# Patient Record
Sex: Male | Born: 1975 | Race: White | Hispanic: No | Marital: Married | Smoking: Current every day smoker
Health system: Southern US, Community
[De-identification: ages and names within clinical notes are randomized; demographics above are authoritative.]

## PROBLEM LIST (undated history)

## (undated) DIAGNOSIS — M1990 Unspecified osteoarthritis, unspecified site: Secondary | ICD-10-CM

## (undated) DIAGNOSIS — J449 Chronic obstructive pulmonary disease, unspecified: Secondary | ICD-10-CM

## (undated) DIAGNOSIS — M9110 Juvenile osteochondrosis of head of femur [Legg-Calve-Perthes], unspecified leg: Secondary | ICD-10-CM

## (undated) DIAGNOSIS — R42 Dizziness and giddiness: Secondary | ICD-10-CM

## (undated) DIAGNOSIS — K2970 Gastritis, unspecified, without bleeding: Secondary | ICD-10-CM

## (undated) DIAGNOSIS — Z972 Presence of dental prosthetic device (complete) (partial): Secondary | ICD-10-CM

## (undated) DIAGNOSIS — K219 Gastro-esophageal reflux disease without esophagitis: Secondary | ICD-10-CM

## (undated) DIAGNOSIS — I10 Essential (primary) hypertension: Secondary | ICD-10-CM

## (undated) DIAGNOSIS — K449 Diaphragmatic hernia without obstruction or gangrene: Secondary | ICD-10-CM

## (undated) DIAGNOSIS — R490 Dysphonia: Secondary | ICD-10-CM

## (undated) DIAGNOSIS — C4490 Unspecified malignant neoplasm of skin, unspecified: Secondary | ICD-10-CM

## (undated) DIAGNOSIS — J040 Acute laryngitis: Secondary | ICD-10-CM

## (undated) HISTORY — PX: SKIN CANCER EXCISION: SHX779

## (undated) HISTORY — PX: HIP SURGERY: SHX245

---

## 2005-09-18 ENCOUNTER — Ambulatory Visit (HOSPITAL_COMMUNITY): Admission: RE | Admit: 2005-09-18 | Discharge: 2005-09-18 | Payer: Self-pay | Admitting: Orthopedic Surgery

## 2007-10-04 ENCOUNTER — Ambulatory Visit (HOSPITAL_COMMUNITY): Admission: RE | Admit: 2007-10-04 | Discharge: 2007-10-04 | Payer: Self-pay | Admitting: Orthopedic Surgery

## 2008-06-04 ENCOUNTER — Emergency Department: Payer: Self-pay | Admitting: Emergency Medicine

## 2009-01-12 ENCOUNTER — Ambulatory Visit: Payer: Self-pay | Admitting: Unknown Physician Specialty

## 2010-03-15 ENCOUNTER — Ambulatory Visit: Payer: Self-pay | Admitting: Family Medicine

## 2010-03-20 ENCOUNTER — Ambulatory Visit: Payer: Self-pay | Admitting: Family Medicine

## 2010-04-12 ENCOUNTER — Ambulatory Visit: Payer: Self-pay | Admitting: Family Medicine

## 2011-02-16 ENCOUNTER — Emergency Department: Payer: Self-pay | Admitting: Emergency Medicine

## 2011-02-26 ENCOUNTER — Ambulatory Visit: Payer: Self-pay | Admitting: Family Medicine

## 2011-04-07 ENCOUNTER — Emergency Department: Payer: Self-pay | Admitting: Unknown Physician Specialty

## 2011-04-22 NOTE — Op Note (Signed)
NAME:  KELTIN, BAIRD NO.:  [id number]   MEDICAL RECORD NO.:  [id number]          PATIENT TYPE:  AMB   LOCATION:  DAY                          FACILITY:  [facility]   PHYSICIAN:  Ollen Gross, M.D.    DATE OF BIRTH:  1976/08/29   DATE OF PROCEDURE:  10/04/2007  DATE OF DISCHARGE:                               OPERATIVE REPORT   PREOPERATIVE DIAGNOSIS:  Right hip labral tear.   POSTOPERATIVE DIAGNOSES:  1. Right hip labral tear.  2. Chondral defect.  3. Hypertrophic ligamentum teres.   PROCEDURE:  Right hip arthroscopy with labral debridement and  chondroplasty.   SURGEON:  Aluisio.   ASSISTANT:  Avel Peace, P.A.-C.   ANESTHESIA:  General.   ESTIMATED BLOOD LOSS:  Minimal.   DRAINS:  None.   COMPLICATIONS:  None.   CONDITION:  Stable to recovery.   HISTORY:  Shigeru is a 35 year old male, history of Perthes disease as a  teenager.  He has gone on to develop significant right hip pain and  mechanical symptoms.  The popping is causing the most pain for him.  He  has had intra-articular injection on two occasions which has provided  excellent pain relief.  The mechanical symptoms are getting worse.  He  is not at a stage where he needs a total hip, and he presents now for  arthroscopy and debridement.   PROCEDURE IN DETAIL:  After successful administration of general  anesthetic, the patient was placed in the left lateral decubitus  position, and a perineal well-padded post is placed with the right leg  draped over the post, and the left leg is well padded.  The right foot  is placed in a traction boot which was well padded and is well secured.  Under fluoroscopic guidance, we provided traction to the hip to  adequately distract it.  The thigh is then prepped and draped in the  usual sterile fashion.  Spinal needles were then passed through the skin  into the joint at the anterior posterior peritrochanteric portals.  A  small incision was made  posteriorly, dilator placed, and the inflow  cannula passed into the joint.  Arthroscopic visualization proceeds.  The posterior aspect of the acetabulum and posterior labrum were normal.  Central acetabular and labrum were normal.  Anteriorly there is no  chondral defect, and the anterior labrum looks normal except that  anterior inferior there is a tear which appears to be protruding into  the joint just adjacent to an area of the femoral head where there is a  chondral defect.  The ligamentum teres is significantly hypertrophied,  and just adjacent to that, there is an area of chondral delamination on  the femoral head.  We created the anterior portal over the spinal needle  and with a combination of shaver and the ArthroCare, debrided the  ligament of teres back to a normal size.  The labral tear was debrided  back to a stable base with the shaver, and the ArthroCare sealed it off.  The chondral defects were treated with a shaver.  There was  about a 2 x  2-mm tethered piece of bone which was tethered to the cartilage, and I  removed that with the pituitary grabber.  We debrided the defect back to  a stable cartilaginous base, and it was not fully delaminating off the  bone.  It was probed and found to be stable and no longer mobile.  I  again inspected the joint.  There were no further tears or any loose  bodies present.  The arthroscopic equipment was then removed from the  anterior portal and 30 mL of 0.25% Marcaine with epi injected through  the inflow cannula and then that is removed.  Traction is removed off  the joint, and the portal sites were closed with interrupted 4-0 nylon.  A bulky, sterile dressing was applied.  Fluoroscopy confirms concentric  reduction.  The patient was then placed supine on the operating table,  awakened, and transferred to recovery in stable condition.      Ollen Gross, M.D.  Electronically Signed     FA/MEDQ  D:  10/04/2007  T:  10/05/2007   Job:  [id number]

## 2011-09-17 LAB — COMPREHENSIVE METABOLIC PANEL
ALT: 26
AST: 22
Albumin: 3.8
Alkaline Phosphatase: 80
BUN: 9
CO2: 28
Calcium: 9
Chloride: 99
Creatinine, Ser: 1.05
GFR calc Af Amer: >60
GFR calc non Af Amer: >60
Glucose, Bld: 100 — ABNORMAL HIGH
Potassium: 4
Sodium: 134 — ABNORMAL LOW
Total Bilirubin: 0.8
Total Protein: 6.8

## 2011-09-17 LAB — CBC
HCT: 44.1
Hemoglobin: 15
MCHC: 33.9
MCV: 89.2
Platelets: 271
RBC: 4.95
RDW: 13.6
WBC: 10.2

## 2011-09-17 LAB — PROTIME-INR
INR: 1.1
Prothrombin Time: 14.2

## 2011-09-17 LAB — APTT: aPTT: 28

## 2011-09-17 LAB — URINALYSIS, ROUTINE W REFLEX MICROSCOPIC
Bilirubin Urine: NEGATIVE
Glucose, UA: NEGATIVE
Hgb urine dipstick: NEGATIVE
Ketones, ur: NEGATIVE
Nitrite: NEGATIVE
Protein, ur: NEGATIVE
Specific Gravity, Urine: 1.02
Urobilinogen, UA: 0.2
pH: 6.5

## 2011-09-17 LAB — TYPE AND SCREEN
ABO/RH(D): A POS
Antibody Screen: NEGATIVE

## 2011-09-17 LAB — ABO/RH: ABO/RH(D): A POS

## 2012-06-04 ENCOUNTER — Ambulatory Visit: Payer: Self-pay | Admitting: Emergency Medicine

## 2012-06-04 LAB — URINALYSIS, COMPLETE
Bacteria: NEGATIVE
Bilirubin,UR: NEGATIVE
Blood: NEGATIVE
Glucose,UR: NEGATIVE mg/dL (ref 0–75)
Ketone: NEGATIVE
Leukocyte Esterase: NEGATIVE
Nitrite: NEGATIVE
Ph: 7 (ref 4.5–8.0)
Protein: NEGATIVE
Specific Gravity: 1.015 (ref 1.003–1.030)
Squamous Epithelial: NONE SEEN

## 2012-09-18 ENCOUNTER — Ambulatory Visit: Payer: Self-pay | Admitting: Internal Medicine

## 2013-03-31 ENCOUNTER — Ambulatory Visit: Payer: Self-pay

## 2013-10-10 ENCOUNTER — Ambulatory Visit: Payer: Self-pay

## 2014-05-22 ENCOUNTER — Emergency Department: Payer: Self-pay | Admitting: Emergency Medicine

## 2014-05-22 LAB — CBC
HCT: 49.3 % (ref 40.0–52.0)
HGB: 16.1 g/dL (ref 13.0–18.0)
MCH: 30.4 pg (ref 26.0–34.0)
MCHC: 32.7 g/dL (ref 32.0–36.0)
MCV: 93 fL (ref 80–100)
Platelet: 279 10*3/uL (ref 150–440)
RBC: 5.3 10*6/uL (ref 4.40–5.90)
RDW: 13.4 % (ref 11.5–14.5)
WBC: 15 10*3/uL — ABNORMAL HIGH (ref 3.8–10.6)

## 2014-05-22 LAB — URINALYSIS, COMPLETE
Bacteria: NONE SEEN
Bilirubin,UR: NEGATIVE
Glucose,UR: NEGATIVE mg/dL (ref 0–75)
Ketone: NEGATIVE
Leukocyte Esterase: NEGATIVE
Nitrite: NEGATIVE
Ph: 6 (ref 4.5–8.0)
Protein: NEGATIVE
RBC,UR: 125 /HPF (ref 0–5)
Specific Gravity: 1.016 (ref 1.003–1.030)
Squamous Epithelial: NONE SEEN
WBC UR: NONE SEEN /HPF (ref 0–5)

## 2014-05-22 LAB — COMPREHENSIVE METABOLIC PANEL
Albumin: 3.7 g/dL (ref 3.4–5.0)
Alkaline Phosphatase: 72 U/L
Anion Gap: 7 (ref 7–16)
BUN: 18 mg/dL (ref 7–18)
Bilirubin,Total: 0.3 mg/dL (ref 0.2–1.0)
Calcium, Total: 9.1 mg/dL (ref 8.5–10.1)
Chloride: 106 mmol/L (ref 98–107)
Co2: 27 mmol/L (ref 21–32)
Creatinine: 1.33 mg/dL — ABNORMAL HIGH (ref 0.60–1.30)
EGFR (African American): >60
EGFR (Non-African Amer.): >60
Glucose: 112 mg/dL — ABNORMAL HIGH (ref 65–99)
Osmolality: 282 (ref 275–301)
Potassium: 4.1 mmol/L (ref 3.5–5.1)
SGOT(AST): 27 U/L (ref 15–37)
SGPT (ALT): 28 U/L (ref 12–78)
Sodium: 140 mmol/L (ref 136–145)
Total Protein: 7.1 g/dL (ref 6.4–8.2)

## 2014-09-15 ENCOUNTER — Ambulatory Visit: Payer: Self-pay | Admitting: Physician Assistant

## 2014-12-14 ENCOUNTER — Ambulatory Visit: Payer: Self-pay | Admitting: Otolaryngology

## 2014-12-14 HISTORY — PX: THROAT SURGERY: SHX803

## 2015-02-01 ENCOUNTER — Encounter: Payer: Self-pay | Admitting: Otolaryngology

## 2015-02-06 ENCOUNTER — Encounter: Payer: Self-pay | Admitting: Otolaryngology

## 2015-04-02 LAB — SURGICAL PATHOLOGY

## 2015-04-08 NOTE — Op Note (Signed)
PATIENT NAME:  Kurt Patel, Kurt Patel MR#:  [id number] DATE OF BIRTH:  1976-03-04  DATE OF PROCEDURE:  12/14/2014  PREOPERATIVE DIAGNOSES: Leukoplakia of the vocal cords bilateral, dysphonia, tobacco abuse.   POSTOPERATIVE DIAGNOSIS: Leukoplakia of the vocal cords bilateral, dysphonia, tobacco abuse.   PROCEDURE PERFORMED: Suspension microlaryngoscopy with microflap excision.   SURGEON: Jerene Bears, M.D.   ANESTHESIA: General endotracheal anesthesia.   ESTIMATED BLOOD LOSS: Less than 1 mL   IV FLUIDS: Please see anesthesia record.   COMPLICATIONS: None.   DRAINS AND STENT PLACEMENT: None.   SPECIMENS: Left true vocal fold nodule biopsy and left mid vocal fold microflap excision.   INDICATIONS FOR PROCEDURE: The patient is a 39 year old male with history of chronic dysphonia, tobacco abuse, vocal abuse, found to have significant leukoplakia on previous exam in the office that had waxed and waned bilaterally. In the office, it was seen to be worse on his right true vocal fold on last laryngoscopy.   OPERATIVE FINDINGS: Severe sulcus and pseudosulcus, and bilateral vocal process ulcerations. Leukoplakia had actually improved on the right true vocal fold but had an area of thickened leukoplakia on the dorsal aspect of his left mid true vocal fold just adjacent to an ulceration. Microflap excision of this thickening leukoplakia was made. A small nodule posteriorly was also biopsied.   DESCRIPTION OF PROCEDURE: As the patient was identified in holding, benefits and risks of the procedure were discussed and consent was reviewed. The patient was taken to the Operating Room and placed in the supine position. General endotracheal anesthesia was induced with an MLT endotracheal tube. The patient was rotated 90 degrees, a shoulder roll was placed, a wettened Ray-Tec was placed to protect the patient's upper gums as the patient was edentulous on his maxilla. A Dedo laryngoscope was inserted in the  patient's oral cavity and advanced for visualization of the base of tongue, which revealed no abnormality, epiglottis no abnormality, the hypopharynx and piriform sinuses no abnormality. Then, the larynx was visualized and true vocal folds demonstrated multiple sulci bilaterally, a large ulceration of the vocal process bilaterally, left greater than right and heaping of tissue just inferior and superior to the ulceration on the left side. Palpation revealed some thickening of the leukoplakia on the dorsal aspect of his left true vocal fold. Anterior commissure revealed some edema anteriorly and pseudosulcus but no other abnormal mass or lesions.   At this time, epinephrine was placed in the patient's left true vocal fold. The decision was made to biopsy the worst looking area of leukoplakia in the most thickened area of leukoplakia. A sickle knife was used to make a vertical incision and then a microelevator was used to dissect the area of the lamina propria. Then, horizontal incisions with a sickle knife were made. This microflap was grasped with a right-to-left biting microclamp, and then using a sickle knife,  a small window of tissue was removed. Hemostasis was achieved using topical epinephrine. At this time, palpation of the heaped up  edges of the ulceration inferiorly, a decision was made to trim these edges and microclamp was used to grasp this small nodule and this was cut with a straight edge to the vocal cord with a 45 degree up-biting microscissors. This was also repeated anteriorly in front of the ulceration.   At this time, epinephrine was used for hemostasis and 4% lidocaine was spritzed on the patient's larynx and then the Dedo laryngoscope was removed from the patient's oral cavity without injury to  teeth, lips or gums. The wettened Ray-Tec was removed as well. The patient tolerated the procedure well.    ____________________________ Jerene Bears, MD ccv:kl D: 12/14/2014 08:10:27  ET T: 12/14/2014 12:44:39 ET JOB#: [id number]  cc: Jerene Bears, MD, <Dictator> Jerene Bears MD ELECTRONICALLY SIGNED 12/20/2014 13:24

## 2015-05-15 ENCOUNTER — Encounter: Payer: Self-pay | Admitting: *Deleted

## 2015-05-15 ENCOUNTER — Emergency Department
Admission: EM | Admit: 2015-05-15 | Discharge: 2015-05-15 | Disposition: A | Discharge status: Home / Self Care | Payer: Worker's Compensation | Attending: Emergency Medicine | Admitting: Emergency Medicine

## 2015-05-15 ENCOUNTER — Emergency Department: Payer: Worker's Compensation

## 2015-05-15 ENCOUNTER — Encounter: Payer: Self-pay | Admitting: Emergency Medicine

## 2015-05-15 DIAGNOSIS — R21 Rash and other nonspecific skin eruption: Secondary | ICD-10-CM | POA: Insufficient documentation

## 2015-05-15 DIAGNOSIS — Z720 Tobacco use: Secondary | ICD-10-CM | POA: Insufficient documentation

## 2015-05-15 DIAGNOSIS — T65891D Toxic effect of other specified substances, accidental (unintentional), subsequent encounter: Secondary | ICD-10-CM | POA: Diagnosis not present

## 2015-05-15 DIAGNOSIS — S0501XA Injury of conjunctiva and corneal abrasion without foreign body, right eye, initial encounter: Secondary | ICD-10-CM | POA: Diagnosis not present

## 2015-05-15 DIAGNOSIS — X58XXXD Exposure to other specified factors, subsequent encounter: Secondary | ICD-10-CM | POA: Insufficient documentation

## 2015-05-15 DIAGNOSIS — Y9389 Activity, other specified: Secondary | ICD-10-CM | POA: Insufficient documentation

## 2015-05-15 DIAGNOSIS — Y9289 Other specified places as the place of occurrence of the external cause: Secondary | ICD-10-CM | POA: Insufficient documentation

## 2015-05-15 DIAGNOSIS — H109 Unspecified conjunctivitis: Secondary | ICD-10-CM | POA: Insufficient documentation

## 2015-05-15 DIAGNOSIS — T2691XD Corrosion of right eye and adnexa, part unspecified, subsequent encounter: Secondary | ICD-10-CM

## 2015-05-15 DIAGNOSIS — L550 Sunburn of first degree: Secondary | ICD-10-CM | POA: Diagnosis not present

## 2015-05-15 DIAGNOSIS — T2050XA Corrosion of first degree of head, face, and neck, unspecified site, initial encounter: Secondary | ICD-10-CM | POA: Diagnosis not present

## 2015-05-15 DIAGNOSIS — Z79899 Other long term (current) drug therapy: Secondary | ICD-10-CM | POA: Insufficient documentation

## 2015-05-15 DIAGNOSIS — S0591XD Unspecified injury of right eye and orbit, subsequent encounter: Secondary | ICD-10-CM | POA: Diagnosis present

## 2015-05-15 DIAGNOSIS — Z77098 Contact with and (suspected) exposure to other hazardous, chiefly nonmedicinal, chemicals: Secondary | ICD-10-CM | POA: Insufficient documentation

## 2015-05-15 DIAGNOSIS — Y990 Civilian activity done for income or pay: Secondary | ICD-10-CM | POA: Insufficient documentation

## 2015-05-15 DIAGNOSIS — X58XXXA Exposure to other specified factors, initial encounter: Secondary | ICD-10-CM | POA: Insufficient documentation

## 2015-05-15 DIAGNOSIS — S0501XD Injury of conjunctiva and corneal abrasion without foreign body, right eye, subsequent encounter: Secondary | ICD-10-CM | POA: Diagnosis not present

## 2015-05-15 DIAGNOSIS — T543X1A Toxic effect of corrosive alkalis and alkali-like substances, accidental (unintentional), initial encounter: Secondary | ICD-10-CM | POA: Diagnosis not present

## 2015-05-15 DIAGNOSIS — T6591XA Toxic effect of unspecified substance, accidental (unintentional), initial encounter: Secondary | ICD-10-CM

## 2015-05-15 HISTORY — DX: Unspecified malignant neoplasm of skin, unspecified: C44.90

## 2015-05-15 MED ORDER — GENTAMICIN SULFATE 0.3 % OP SOLN: 1.0000 [drp] | Freq: Three times a day (TID) | OPHTHALMIC | Status: DC

## 2015-05-15 MED ORDER — TETRACAINE HCL 0.5 % OP SOLN
OPHTHALMIC | Status: AC
  Administered 2015-05-15: 14:00:00
  Filled 2015-05-15: qty 2

## 2015-05-15 MED ORDER — KETOROLAC TROMETHAMINE 10 MG PO TABS: 10.0000 mg | ORAL_TABLET | Freq: Four times a day (QID) | ORAL | Status: DC | PRN

## 2015-05-15 MED ORDER — KETOROLAC TROMETHAMINE 60 MG/2ML IM SOLN
60.0000 mg | Freq: Once | INTRAMUSCULAR | Status: AC
  Administered 2015-05-15: 60 mg via INTRAMUSCULAR

## 2015-05-15 MED ORDER — KETOROLAC TROMETHAMINE 60 MG/2ML IM SOLN
INTRAMUSCULAR | Status: AC
  Administered 2015-05-15: 60 mg via INTRAMUSCULAR
  Filled 2015-05-15: qty 2

## 2015-05-15 MED ORDER — ALOE VESTA 2-N-1 PROTECTIVE EX OINT: TOPICAL_OINTMENT | Freq: Two times a day (BID) | CUTANEOUS | Status: DC | PRN

## 2015-05-15 MED ORDER — DEXAMETHASONE SODIUM PHOSPHATE 10 MG/ML IJ SOLN
10.0000 mg | Freq: Once | INTRAMUSCULAR | Status: AC
  Administered 2015-05-15: 10 mg via INTRAMUSCULAR

## 2015-05-15 MED ORDER — NEOMYCIN-POLYMYXIN-DEXAMETH 3.5-10000-0.1 OP SUSP
2.0000 [drp] | Freq: Once | OPHTHALMIC | Status: AC
  Administered 2015-05-15: 2 [drp] via OPHTHALMIC

## 2015-05-15 MED ORDER — EYE WASH OPHTH SOLN
OPHTHALMIC | Status: AC
  Administered 2015-05-15: 14:00:00
  Filled 2015-05-15: qty 118

## 2015-05-15 MED ORDER — HYDROCODONE-ACETAMINOPHEN 5-325 MG PO TABS
ORAL_TABLET | ORAL | Status: AC
  Administered 2015-05-15: 1 via ORAL
  Filled 2015-05-15: qty 1

## 2015-05-15 MED ORDER — HYDROCODONE-ACETAMINOPHEN 5-325 MG PO TABS: 1.0000 | ORAL_TABLET | Freq: Four times a day (QID) | ORAL | Status: DC | PRN

## 2015-05-15 MED ORDER — FLUORESCEIN SODIUM 1 MG OP STRP
ORAL_STRIP | OPHTHALMIC | Status: AC
  Administered 2015-05-15: 1
  Filled 2015-05-15: qty 1

## 2015-05-15 MED ORDER — DEXAMETHASONE SODIUM PHOSPHATE 10 MG/ML IJ SOLN
INTRAMUSCULAR | Status: AC
  Administered 2015-05-15: 10 mg via INTRAMUSCULAR
  Filled 2015-05-15: qty 1

## 2015-05-15 MED ORDER — HYDROCODONE-ACETAMINOPHEN 5-325 MG PO TABS
1.0000 | ORAL_TABLET | Freq: Once | ORAL | Status: AC
  Administered 2015-05-15: 1 via ORAL

## 2015-05-15 MED ORDER — HYDROXYZINE HCL 50 MG PO TABS: 50.0000 mg | ORAL_TABLET | Freq: Three times a day (TID) | ORAL | Status: DC | PRN

## 2015-05-15 MED ORDER — FLUORESCEIN SODIUM 1 MG OP STRP
ORAL_STRIP | OPHTHALMIC | Status: AC
  Administered 2015-05-15: 14:00:00
  Filled 2015-05-15: qty 1

## 2015-05-15 MED ORDER — TETRACAINE HCL 0.5 % OP SOLN
OPHTHALMIC | Status: AC
  Administered 2015-05-15: 22:00:00
  Filled 2015-05-15: qty 2

## 2015-05-15 MED ORDER — NEOMYCIN-POLYMYXIN-DEXAMETH 3.5-10000-0.1 OP SUSP
OPHTHALMIC | Status: AC
  Administered 2015-05-15: 2 [drp] via OPHTHALMIC
  Filled 2015-05-15: qty 5
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
## 2015-05-15 NOTE — ED Notes (Signed)
Drug screen sent to lab and breath testing completed.

## 2015-07-05 ENCOUNTER — Ambulatory Visit (INDEPENDENT_AMBULATORY_CARE_PROVIDER_SITE_OTHER): Payer: 59 | Admitting: Family Medicine

## 2015-07-05 ENCOUNTER — Encounter: Payer: Self-pay | Admitting: Family Medicine

## 2015-07-05 VITALS — BP 120/60 | HR 84 | Ht 69.0 in | Wt 212.0 lb

## 2015-07-05 DIAGNOSIS — S46911A Strain of unspecified muscle, fascia and tendon at shoulder and upper arm level, right arm, initial encounter: Secondary | ICD-10-CM

## 2015-07-05 MED ORDER — ETODOLAC 500 MG PO TABS: 500.0000 mg | ORAL_TABLET | Freq: Two times a day (BID) | ORAL | Status: DC

## 2015-07-05 NOTE — Progress Notes (Signed)
Name: Kurt Patel.   MRN: [id number]    DOB: 1976/05/07   Date:07/05/2015       Progress Note  Subjective  Chief Complaint  Chief Complaint  Patient presents with  . Shoulder Pain    woke up, reached for cup- shoulder pain in R) shoulder x 6 months ago    Shoulder Pain  The pain is present in the right shoulder. This is a chronic problem. The current episode started more than 1 month ago. There has been no history of extremity trauma (reaching for a cup). The problem occurs constantly. The problem has been gradually worsening. The quality of the pain is described as aching and sharp. The pain is at a severity of 2/10. The pain is mild. Associated symptoms include a limited range of motion. Pertinent negatives include no fever, inability to bear weight, itching, joint locking, joint swelling, numbness, stiffness or tingling. The symptoms are aggravated by activity. He has tried nothing for the symptoms. The treatment provided no relief.    No problem-specific assessment & plan notes found for this encounter.   Past Medical History  Diagnosis Date  . Skin cancer     Past Surgical History  Procedure Laterality Date  . Hip surgery    . Skin cancer excision      back    Family History  Problem Relation Age of Onset  . Diabetes Mother   . Hyperlipidemia Mother   . Hyperlipidemia Father     History   Social History  . Marital Status: Married    Spouse Name: N/A  . Number of Children: N/A  . Years of Education: N/A   Occupational History  . Not on file.   Social History Main Topics  . Smoking status: Current Every Day Smoker  . Smokeless tobacco: Not on file  . Alcohol Use: Yes  . Drug Use: Not on file  . Sexual Activity: Yes   Other Topics Concern  . Not on file   Social History Narrative    Allergies  Allergen Reactions  . Buspar [Buspirone] Other (See Comments)    "loopy"  . Prednisone Other (See Comments)    "makes me angry"     Review of Systems   Constitutional: Negative for fever, chills, weight loss and malaise/fatigue.  HENT: Negative for ear discharge, ear pain and sore throat.   Eyes: Positive for redness. Negative for blurred vision.  Respiratory: Negative for cough, sputum production, shortness of breath and wheezing.   Cardiovascular: Negative for chest pain, palpitations and leg swelling.  Gastrointestinal: Positive for heartburn. Negative for nausea, abdominal pain, diarrhea, constipation, blood in stool and melena.  Genitourinary: Negative for dysuria, urgency, frequency and hematuria.  Musculoskeletal: Negative for myalgias, back pain, joint pain, stiffness and neck pain.  Skin: Negative for itching and rash.  Neurological: Negative for dizziness, tingling, sensory change, focal weakness, numbness and headaches.  Endo/Heme/Allergies: Negative for environmental allergies and polydipsia. Does not bruise/bleed easily.  Psychiatric/Behavioral: Negative for depression and suicidal ideas. The patient is not nervous/anxious and does not have insomnia.      Objective  Filed Vitals:   07/05/15 1337  BP: 120/60  Pulse: 84  Height: 5\' 9"  (1.753 m)  Weight: 212 lb (96.163 kg)    Physical Exam  Constitutional: He is oriented to person, place, and time and well-developed, well-nourished, and in no distress.  HENT:  Head: Normocephalic.  Right Ear: External ear normal.  Left Ear: External ear normal.  Nose:  Nose normal.  Mouth/Throat: Oropharynx is clear and moist.  Eyes: Conjunctivae and EOM are normal. Pupils are equal, round, and reactive to light. Right eye exhibits no discharge. Left eye exhibits no discharge. No scleral icterus.  Neck: Normal range of motion. Neck supple. No JVD present. No tracheal deviation present. No thyromegaly present.  Cardiovascular: Normal rate, regular rhythm, normal heart sounds and intact distal pulses.  Exam reveals no gallop and no friction rub.   No murmur heard. Pulmonary/Chest:  Breath sounds normal. No respiratory distress. He has no wheezes. He has no rales.  Abdominal: Soft. Bowel sounds are normal. He exhibits no mass. There is no hepatosplenomegaly. There is no tenderness. There is no rebound, no guarding and no CVA tenderness.  Musculoskeletal: He exhibits tenderness. He exhibits no edema.  Right deltoid  Lymphadenopathy:    He has no cervical adenopathy.  Neurological: He is alert and oriented to person, place, and time. He has normal sensation, normal strength, normal reflexes and intact cranial nerves. No cranial nerve deficit.  Skin: Skin is warm. No rash noted.  Psychiatric: Mood and affect normal.      Assessment & Plan  Problem List Items Addressed This Visit    None    Visit Diagnoses    Shoulder strain, right, initial encounter    -  Primary    Relevant Medications    etodolac (LODINE) 500 MG tablet    Other Relevant Orders    Ambulatory referral to Orthopedic Surgery         Dr. Otilio Miu [facility]  07/05/2015

## 2015-07-09 ENCOUNTER — Other Ambulatory Visit: Payer: Self-pay

## 2015-07-26 ENCOUNTER — Other Ambulatory Visit: Payer: Self-pay | Admitting: Orthopedic Surgery

## 2015-07-26 DIAGNOSIS — M75111 Incomplete rotator cuff tear or rupture of right shoulder, not specified as traumatic: Secondary | ICD-10-CM

## 2015-07-31 ENCOUNTER — Ambulatory Visit
Admission: RE | Admit: 2015-07-31 | Discharge: 2015-07-31 | Disposition: A | Discharge status: Home / Self Care | Payer: 59 | Source: Ambulatory Visit | Attending: Orthopedic Surgery | Admitting: Orthopedic Surgery

## 2015-07-31 DIAGNOSIS — M25511 Pain in right shoulder: Secondary | ICD-10-CM | POA: Diagnosis present

## 2015-07-31 DIAGNOSIS — M75111 Incomplete rotator cuff tear or rupture of right shoulder, not specified as traumatic: Secondary | ICD-10-CM

## 2015-07-31 DIAGNOSIS — M67813 Other specified disorders of tendon, right shoulder: Secondary | ICD-10-CM | POA: Diagnosis not present

## 2015-07-31 MED ORDER — IOHEXOL 300 MG/ML  SOLN: 10.0000 mL | Freq: Once | INTRAMUSCULAR | Status: DC | PRN

## 2015-07-31 MED ORDER — GADOBENATE DIMEGLUMINE 529 MG/ML IV SOLN: 0.1000 mL | Freq: Once | INTRAVENOUS | Status: DC | PRN

## 2015-08-11 ENCOUNTER — Other Ambulatory Visit: Payer: Self-pay | Admitting: Family Medicine

## 2015-08-11 DIAGNOSIS — M2550 Pain in unspecified joint: Secondary | ICD-10-CM

## 2015-08-17 DIAGNOSIS — M778 Other enthesopathies, not elsewhere classified: Secondary | ICD-10-CM | POA: Insufficient documentation

## 2015-08-17 DIAGNOSIS — M7580 Other shoulder lesions, unspecified shoulder: Secondary | ICD-10-CM

## 2015-08-29 ENCOUNTER — Encounter: Payer: Self-pay | Admitting: *Deleted

## 2015-09-05 ENCOUNTER — Ambulatory Visit
Admission: RE | Admit: 2015-09-05 | Discharge: 2015-09-05 | Disposition: A | Discharge status: Home / Self Care | Payer: 59 | Source: Ambulatory Visit | Attending: Surgery | Admitting: Surgery

## 2015-09-05 ENCOUNTER — Ambulatory Visit: Payer: 59 | Admitting: Student in an Organized Health Care Education/Training Program

## 2015-09-05 ENCOUNTER — Encounter: Payer: Self-pay | Admitting: Surgery

## 2015-09-05 ENCOUNTER — Encounter
Admission: RE | Disposition: A | Discharge status: Home / Self Care | Payer: Self-pay | Source: Ambulatory Visit | Attending: Surgery

## 2015-09-05 DIAGNOSIS — K219 Gastro-esophageal reflux disease without esophagitis: Secondary | ICD-10-CM | POA: Diagnosis not present

## 2015-09-05 DIAGNOSIS — F17200 Nicotine dependence, unspecified, uncomplicated: Secondary | ICD-10-CM | POA: Diagnosis not present

## 2015-09-05 DIAGNOSIS — M7541 Impingement syndrome of right shoulder: Secondary | ICD-10-CM | POA: Diagnosis present

## 2015-09-05 HISTORY — DX: Juvenile osteochondrosis of head of femur (Legg-Calve-Perthes), unspecified leg: M91.10

## 2015-09-05 HISTORY — DX: Dizziness and giddiness: R42

## 2015-09-05 HISTORY — PX: SHOULDER ARTHROSCOPY: SHX128

## 2015-09-05 HISTORY — DX: Unspecified osteoarthritis, unspecified site: M19.90

## 2015-09-05 HISTORY — DX: Gastro-esophageal reflux disease without esophagitis: K21.9

## 2015-09-05 HISTORY — DX: Presence of dental prosthetic device (complete) (partial): Z97.2

## 2015-09-05 SURGERY — ARTHROSCOPY, SHOULDER
Anesthesia: Regional | Laterality: Right | Wound class: Clean

## 2015-09-05 MED ORDER — ONDANSETRON HCL 4 MG/2ML IJ SOLN
INTRAMUSCULAR | Status: DC | PRN
  Administered 2015-09-05: 4 mg via INTRAVENOUS

## 2015-09-05 MED ORDER — LACTATED RINGERS IV SOLN
INTRAVENOUS | Status: DC | PRN
  Administered 2015-09-05: 1000 mL

## 2015-09-05 MED ORDER — LACTATED RINGERS IV SOLN
INTRAVENOUS | Status: DC
  Administered 2015-09-05: 12:00:00 via INTRAVENOUS

## 2015-09-05 MED ORDER — CEFAZOLIN SODIUM-DEXTROSE 2-3 GM-% IV SOLR
2.0000 g | Freq: Once | INTRAVENOUS | Status: AC
  Administered 2015-09-05: 2 g via INTRAVENOUS

## 2015-09-05 MED ORDER — BUPIVACAINE-EPINEPHRINE (PF) 0.5% -1:200000 IJ SOLN
INTRAMUSCULAR | Status: DC | PRN
  Administered 2015-09-05: 23 mL

## 2015-09-05 MED ORDER — OXYCODONE HCL 5 MG PO TABS: 5.0000 mg | ORAL_TABLET | ORAL | Status: DC | PRN

## 2015-09-05 MED ORDER — PROPOFOL 10 MG/ML IV BOLUS
INTRAVENOUS | Status: DC | PRN
  Administered 2015-09-05: 150 mg via INTRAVENOUS

## 2015-09-05 MED ORDER — MIDAZOLAM HCL 5 MG/5ML IJ SOLN
INTRAMUSCULAR | Status: DC | PRN
  Administered 2015-09-05 (×2): 2 mg via INTRAVENOUS

## 2015-09-05 MED ORDER — FENTANYL CITRATE (PF) 250 MCG/5ML IJ SOLN
INTRAMUSCULAR | Status: DC | PRN
  Administered 2015-09-05: 100 ug via INTRAVENOUS

## 2015-09-05 MED ORDER — GLYCOPYRROLATE 0.2 MG/ML IJ SOLN
INTRAMUSCULAR | Status: DC | PRN
  Administered 2015-09-05: 0.1 mg via INTRAVENOUS

## 2015-09-05 MED ORDER — ROPIVACAINE HCL 5 MG/ML IJ SOLN
INTRAMUSCULAR | Status: DC | PRN
  Administered 2015-09-05: 20 mL via PERINEURAL

## 2015-09-05 SURGICAL SUPPLY — 38 items
BIT DRILL JUGRKNT W/NDL BIT2.9 (DRILL) IMPLANT
BLADE FULL RADIUS 3.5 (BLADE) ×3 IMPLANT
BLADE SHAVER 4.5X7 STR FR (MISCELLANEOUS) IMPLANT
BUR ACROMIONIZER 4.0 (BURR) ×3 IMPLANT
BUR BR 5.5 WIDE MOUTH (BURR) IMPLANT
CHLORAPREP W/TINT 26ML (MISCELLANEOUS) ×6 IMPLANT
COVER LIGHT HANDLE UNIVERSAL (MISCELLANEOUS) ×3 IMPLANT
COVER MAYO STAND STRL (DRAPES) ×3 IMPLANT
DRAPE IMP U-DRAPE 54X76 (DRAPES) ×6 IMPLANT
DRILL JUGGERKNOT W/NDL BIT 2.9 (DRILL)
GAUZE PETRO XEROFOAM 1X8 (MISCELLANEOUS) ×3 IMPLANT
GAUZE SPONGE 4X4 12PLY STRL (GAUZE/BANDAGES/DRESSINGS) ×3 IMPLANT
GLOVE BIO SURGEON STRL SZ8 (GLOVE) ×6 IMPLANT
GLOVE INDICATOR 8.0 STRL GRN (GLOVE) ×3 IMPLANT
GOWN STRL REUS W/ TWL LRG LVL3 (GOWN DISPOSABLE) ×1 IMPLANT
GOWN STRL REUS W/ TWL XL LVL3 (GOWN DISPOSABLE) ×1 IMPLANT
GOWN STRL REUS W/TWL LRG LVL3 (GOWN DISPOSABLE) ×2
GOWN STRL REUS W/TWL XL LVL3 (GOWN DISPOSABLE) ×2
IV LACTATED RINGER IRRG 3000ML (IV SOLUTION) ×4
IV LR IRRIG 3000ML ARTHROMATIC (IV SOLUTION) ×2 IMPLANT
KIT CANNULA 8X76-LX IN CANNULA (CANNULA) ×3 IMPLANT
MANIFOLD 4PT FOR NEPTUNE1 (MISCELLANEOUS) ×3 IMPLANT
MAT BLUE FLOOR 46X72 FLO (MISCELLANEOUS) ×3 IMPLANT
NDL MAYO CATGUT SZ5 (NEEDLE)
NDL SUT 5 .5 CRC TPR PNT MAYO (NEEDLE) IMPLANT
NEEDLE HYPO 21X1.5 SAFETY (NEEDLE) ×6 IMPLANT
PACK ARTHROSCOPY SHOULDER (MISCELLANEOUS) ×3 IMPLANT
PAD GROUND ADULT SPLIT (MISCELLANEOUS) IMPLANT
SLING ARM LRG DEEP (SOFTGOODS) ×3 IMPLANT
STAPLER SKIN PROX 35W (STAPLE) ×3 IMPLANT
STRAP BODY AND KNEE 60X3 (MISCELLANEOUS) ×6 IMPLANT
SUT ETHIBOND 0 MO6 C/R (SUTURE) IMPLANT
SUT VIC AB 2-0 CT1 27 (SUTURE)
SUT VIC AB 2-0 CT1 TAPERPNT 27 (SUTURE) IMPLANT
SYR 30ML LL (SYRINGE) IMPLANT
TAPE MICROFOAM 4IN (TAPE) ×3 IMPLANT
TUBING ARTHRO INFLOW-ONLY STRL (TUBING) ×3 IMPLANT
WAND HAND CNTRL MULTIVAC 90 (MISCELLANEOUS) ×3 IMPLANT

## 2015-09-05 NOTE — Op Note (Signed)
09/05/2015  2:51 PM  Patient:   Kurt Patel.  Pre-Op Diagnosis:   Impingement/tendinopathy, right shoulder.  Postoperative diagnosis: Impingement/tendinopathy with labral fraying, right shoulder.  Procedure: Arthroscopic labral debridement and arthroscopic subacromial decompression, right shoulder.  Anesthesia: General LMA with interscalene block placed preoperatively by the anesthesiologist.  Surgeon:   Pascal Lux, MD  Assistant:   None  Findings: As above. There was fraying of the labrum posterior superiorly and anteriorly without frank detachment from the glenoid. The biceps tendon itself was in excellent condition, as was the rotator cuff. 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 There did appear to be some instability of an os acromiale lesion of uncertain clinical significance.  Complications: None  Fluids:   850 cc  Estimated blood loss: 5 cc  Tourniquet time: None  Drains: None  Closure: Staples   Brief clinical note: The patient is a 39 year old male with a history of right shoulder pain. The patient's symptoms have progressed despite medications, activity modification, etc. The patient's history and examination are consistent with impingement/tendinopathy. An MRI scan was inconclusive for significant intra-articular pathology. The patient presents at this time for arthroscopy, debridement, and subacromial decompression.  Procedure: The patient underwent placement of an interscalene block by the anesthesiologist in the preoperative holding area under IV sedation. The patient then was brought into the operating room where he underwent general laryngal mask anesthesia before being repositioned in the beach chair position using the beach chair positioner. The right shoulder and upper extremity were  prepped with ChloraPrep solution before being draped sterilely. Preoperative antibiotics were administered. A timeout was performed to confirm the proper surgical site before the expected portal sites and incision site were injected with 0.5% Sensorcaine with epinephrine. A posterior portal was created and the glenohumeral joint thoroughly inspected with the findings as described above. An anterior portal was created using an outside-in technique. The labrum and rotator cuff were further probed, again confirming the above-noted findings. The areas of labral fraying were debrided using the full-radius resector. The ArthroCare wand was inserted and used to obtain hemostasis as well as to "anneal" the labrum superiorly and anteriorly. The instruments were removed from the joint after suctioning the excess fluid.  The camera was repositioned through the posterior portal into the subacromial space. A separate lateral portal was created using an outside-in technique. The 3.5 mm full-radius resector was introduced and used to perform a subtotal bursectomy. The ArthroCare wand was then inserted and used to remove the periosteal tissue off the undersurface of the anterior third of the acromion as well as to recess the coracoacromial ligament from its attachment along the anterior and lateral margins of the acromion. The 4.0 mm acromionizing bur was introduced and used to complete the decompression by removing the undersurface of the anterior third of the acromion. While performing the decompression, it was noted that there was some instability of the os acromiale lesion anteriorly. As he did not have any tenderness over this area preoperatively, it was not felt to be the source of the patient's pathology and therefore not addressed further. The full radius resector was reintroduced to remove any residual bony debris before the ArthroCare wand was reintroduced to obtain hemostasis. The instruments were then removed from the  subacromial space after suctioning the excess fluid.  The portal sites were closed using staples before a  sterile bulky dressing was applied to the shoulder and the arm placed into a shoulder sling. The patient was then awakened, extubated, and returned to the recovery room in satisfactory condition after tolerating the procedure well.

## 2015-09-05 NOTE — Progress Notes (Signed)
Assisted Scott Mculloch ANMD with right, interscalene  block. Side rails up, monitors on throughout procedure. See vital signs in flow sheet. Tolerated Procedure well.

## 2015-09-05 NOTE — Transfer of Care (Signed)
Immediate Anesthesia Transfer of Care Note  Patient: Kurt Patel.  Procedure(s) Performed: Procedure(s) with comments: BICECPS TENDONESIS PROBABLE (Right) - MOVE TO RM 3 PER CECE  ARTHROSCOPY SHOULDER DEBRIEDMENT (Right)  Patient Location: PACU  Anesthesia Type: General, Regional  Level of Consciousness: awake, alert  and patient cooperative  Airway and Oxygen Therapy: Patient Spontanous Breathing and Patient connected to supplemental oxygen  Post-op Assessment: Post-op Vital signs reviewed, Patient's Cardiovascular Status Stable, Respiratory Function Stable, Patent Airway and No signs of Nausea or vomiting  Post-op Vital Signs: Reviewed and stable  Complications: No apparent anesthesia complications

## 2015-09-05 NOTE — Anesthesia Preprocedure Evaluation (Signed)
Anesthesia Evaluation  Patient identified by MRN, date of birth, ID band  Airway Mallampati: II  TM Distance: >3 FB Neck ROM: Full    Dental  (+) Upper Dentures   Pulmonary Current Smoker,           Cardiovascular      Neuro/Psych    GI/Hepatic GERD  Medicated and Controlled,  Endo/Other    Renal/GU      Musculoskeletal   Abdominal   Peds  Hematology   Anesthesia Other Findings   Reproductive/Obstetrics                             Anesthesia Physical Anesthesia Plan  ASA: II  Anesthesia Plan: General and Regional   Post-op Pain Management: GA combined w/ Regional for post-op pain   Induction: Intravenous  Airway Management Planned:   Additional Equipment:   Intra-op Plan:   Post-operative Plan:   Informed Consent: I have reviewed the patients History and Physical, chart, labs and discussed the procedure including the risks, benefits and alternatives for the proposed anesthesia with the patient or authorized representative who has indicated his/her understanding and acceptance.     Plan Discussed with: CRNA  Anesthesia Plan Comments:         Anesthesia Quick Evaluation

## 2015-09-05 NOTE — Anesthesia Procedure Notes (Addendum)
Anesthesia Regional Block:  Interscalene brachial plexus block  Pre-Anesthetic Checklist: ,, timeout performed, Correct Patient, Correct Site, Correct Laterality, Correct Procedure, Correct Position, site marked, Risks and benefits discussed,  Surgical consent,  Pre-op evaluation,  At surgeon's request and post-op pain management  Laterality: Right  Prep: chloraprep       Needles:  Injection technique: Single-shot  Needle Type: Stimiplex     Needle Length: 4cm 4 cm Needle Gauge: 21 and 21 G    Additional Needles:  Procedures: ultrasound guided (picture in chart) Interscalene brachial plexus block Narrative:  Start time: 09/05/2015 12:42 PM End time: 09/05/2015 12:47 PM Injection made incrementally with aspirations every 5 mL.  Performed by: Personally  Anesthesiologist: MCCULLOCH, SCOTT  Additional Notes: Functioning IV was confirmed and monitors applied. Ultrasound guidance: relevant anatomy identified, needle position confirmed, local anesthetic spread visualized around nerve(s)., vascular puncture avoided.  Image printed for medical record.  Negative aspiration and no paresthesias; incremental administration of local anesthetic. The patient tolerated the procedure well. Vitals signes recorded in RN notes.   Procedure Name: LMA Insertion Date/Time: 09/05/2015 1:48 PM Performed by: Mayme Genta Pre-anesthesia Checklist: Patient identified, Emergency Drugs available, Suction available, Timeout performed and Patient being monitored Patient Re-evaluated:Patient Re-evaluated prior to inductionOxygen Delivery Method: Circle system utilized Preoxygenation: Pre-oxygenation with 100% oxygen Intubation Type: IV induction LMA: LMA inserted LMA Size: 4.0 Number of attempts: 1 Placement Confirmation: positive ETCO2 and breath sounds checked- equal and bilateral Tube secured with: Tape

## 2015-09-05 NOTE — H&P (Signed)
Paper H&P to be scanned into permanent record. H&P reviewed. No changes. 

## 2015-09-05 NOTE — Discharge Instructions (Signed)
General Anesthesia, Care After Refer to this sheet in the next few weeks. These instructions provide you with information on caring for yourself after your procedure. Your health care provider may also give you more specific instructions. Your treatment has been planned according to current medical practices, but problems sometimes occur. Call your health care provider if you have any problems or questions after your procedure. WHAT TO EXPECT AFTER THE PROCEDURE After the procedure, it is typical to experience:  Sleepiness.  Nausea and vomiting. HOME CARE INSTRUCTIONS  For the first 24 hours after general anesthesia:  Have a responsible person with you.  Do not drive a car. If you are alone, do not take public transportation.  Do not drink alcohol.  Do not take medicine that has not been prescribed by your health care provider.  Do not sign important papers or make important decisions.  You may resume a normal diet and activities as directed by your health care provider.  Change bandages (dressings) as directed.  If you have questions or problems that seem related to general anesthesia, call the hospital and ask for the anesthetist or anesthesiologist on call. SEEK MEDICAL CARE IF:  You have nausea and vomiting that continue the day after anesthesia.  You develop a rash. SEEK IMMEDIATE MEDICAL CARE IF:   You have difficulty breathing.  You have chest pain.  You have any allergic problems. Document Released: 03/02/2001 Document Revised: 11/29/2013 Document Reviewed: 06/09/2013 [facility] Patient Information 2015 [place], [place]. This information is not intended to replace advice given to you by your health care provider. Make sure you discuss any questions you have with your health care provider.  Keep dressing dry and intact.  May shower after dressing changed on post-op day #4 (Sunday).  Cover staples with Band-Aids after drying off. Apply ice frequently to shoulder. Keep  shoulder immobilizer on at all times except may remove for bathing purposes. Follow-up in 10-14 days or as scheduled.

## 2015-09-05 NOTE — Anesthesia Postprocedure Evaluation (Signed)
  Anesthesia Post-op Note  Patient: Kurt Patel.  Procedure(s) Performed: Procedure(s): ARTHROSCOPY SHOULDER DEBRIEDMENT and decompression (Right)  Anesthesia type:General, Regional  Patient location: PACU  Post pain: Pain level controlled  Post assessment: Post-op Vital signs reviewed, Patient's Cardiovascular Status Stable, Respiratory Function Stable, Patent Airway and No signs of Nausea or vomiting  Post vital signs: Reviewed and stable  Last Vitals:  Filed Vitals:   09/05/15 1515  BP: 124/79  Pulse: 92  Temp:   Resp: 17    Level of consciousness: awake, alert  and patient cooperative  Complications: No apparent anesthesia complications

## 2015-09-06 DIAGNOSIS — S43439A Superior glenoid labrum lesion of unspecified shoulder, initial encounter: Secondary | ICD-10-CM | POA: Insufficient documentation

## 2015-11-21 ENCOUNTER — Other Ambulatory Visit: Payer: Self-pay | Admitting: Surgery

## 2015-11-21 DIAGNOSIS — S43431A Superior glenoid labrum lesion of right shoulder, initial encounter: Secondary | ICD-10-CM

## 2015-11-21 DIAGNOSIS — M7581 Other shoulder lesions, right shoulder: Secondary | ICD-10-CM

## 2015-11-21 DIAGNOSIS — M778 Other enthesopathies, not elsewhere classified: Secondary | ICD-10-CM

## 2015-11-21 DIAGNOSIS — M7500 Adhesive capsulitis of unspecified shoulder: Secondary | ICD-10-CM | POA: Insufficient documentation

## 2015-12-07 ENCOUNTER — Ambulatory Visit
Admission: RE | Admit: 2015-12-07 | Discharge: 2015-12-07 | Disposition: A | Discharge status: Home / Self Care | Payer: 59 | Source: Ambulatory Visit | Attending: Surgery | Admitting: Surgery

## 2015-12-07 DIAGNOSIS — M7581 Other shoulder lesions, right shoulder: Secondary | ICD-10-CM

## 2015-12-07 DIAGNOSIS — M25511 Pain in right shoulder: Secondary | ICD-10-CM | POA: Insufficient documentation

## 2015-12-07 DIAGNOSIS — Z9889 Other specified postprocedural states: Secondary | ICD-10-CM | POA: Diagnosis present

## 2015-12-07 DIAGNOSIS — S43431A Superior glenoid labrum lesion of right shoulder, initial encounter: Secondary | ICD-10-CM

## 2015-12-07 DIAGNOSIS — M778 Other enthesopathies, not elsewhere classified: Secondary | ICD-10-CM

## 2015-12-07 MED ORDER — IOHEXOL 300 MG/ML  SOLN
10.0000 mL | Freq: Once | INTRAMUSCULAR | Status: AC | PRN
  Administered 2015-12-07: 10 mL via INTRA_ARTICULAR

## 2015-12-07 MED ORDER — GADOBENATE DIMEGLUMINE 529 MG/ML IV SOLN
0.1000 mL | Freq: Once | INTRAVENOUS | Status: AC | PRN
  Administered 2015-12-07: 0.1 mL via INTRA_ARTICULAR

## 2016-02-07 IMAGING — MR MR SHOULDER*R* W/CM
6 series · 40 of 40 positions shown · IV contrast (agent unspecified)
Comparison: None.

CLINICAL DATA: Right shoulder pain for 12-14 months. Limited range
of motion.

EXAM:
MR ARTHROGRAM OF THE RIGHT SHOULDER
TECHNIQUE: Multiplanar, multisequence MR imaging of the right shoulder was
performed following the administration of intra-articular contrast.
CONTRAST:  See Injection Documentation.

[Series 2: T1 fat-sat · axial · 4.0mm · 0.47mm/px · z∈[-39,+71]mm · 8 of 26 slices shown (1 of 4)]
[im 1/26]
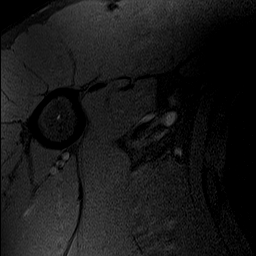
[im 4/26]
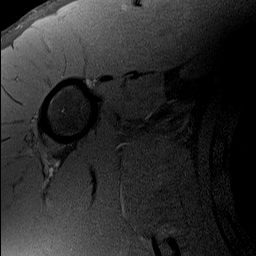
[im 8/26]
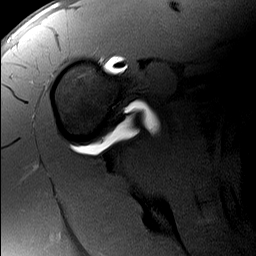
[im 11/26]
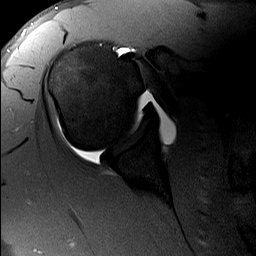
[im 15/26]
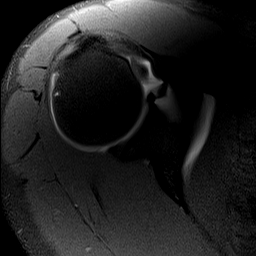
[im 18/26]
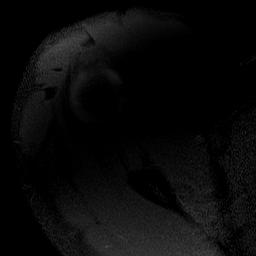
[im 22/26]
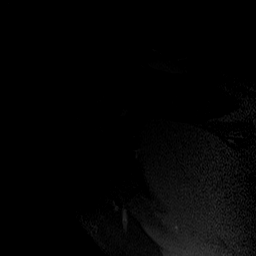
[im 26/26]
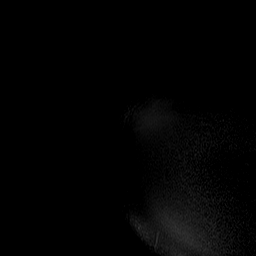

[Series 3: T1 fat-sat · oblique · 4.0mm · 0.62mm/px · 6 of 20 slices shown (2 of 4)]
[im 1/20]
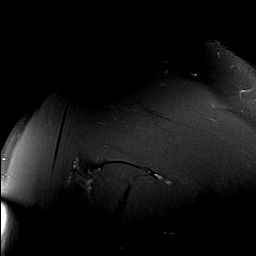
[im 4/20]
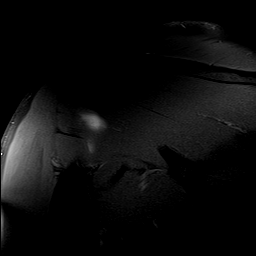
[im 8/20]
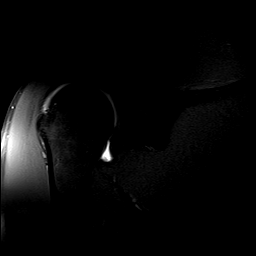
[im 12/20]
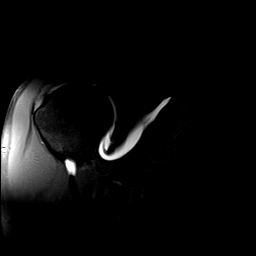
[im 16/20]
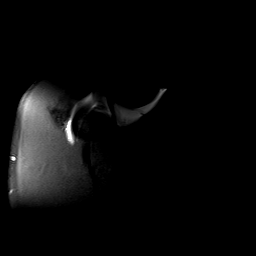
[im 20/20]
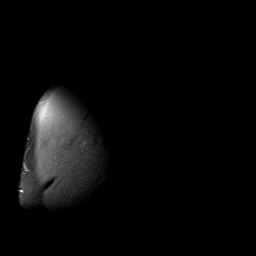

[Series 4: T2 fat-sat · oblique · 4.0mm · 0.62mm/px · 6 of 20 slices shown (1 of 2)]
[im 1/20]
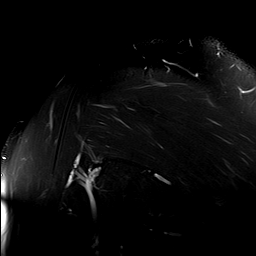
[im 4/20]
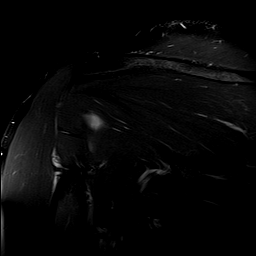
[im 8/20]
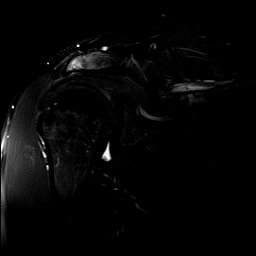
[im 12/20]
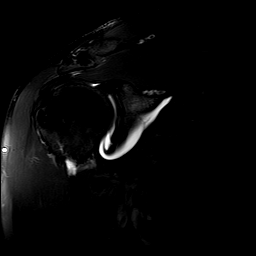
[im 16/20]
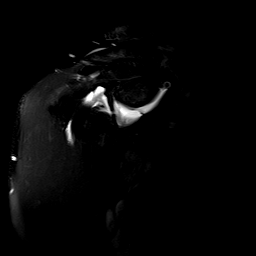
[im 20/20]
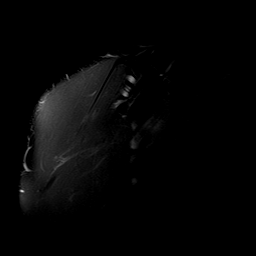

[Series 5: T1 fat-sat · oblique · non-contrast · 4.0mm · 0.42mm/px · 6 of 20 slices shown (3 of 4)]
[im 1/20]
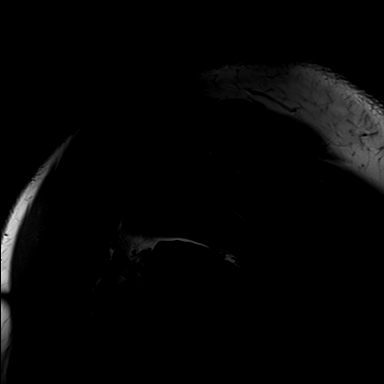
[im 4/20]
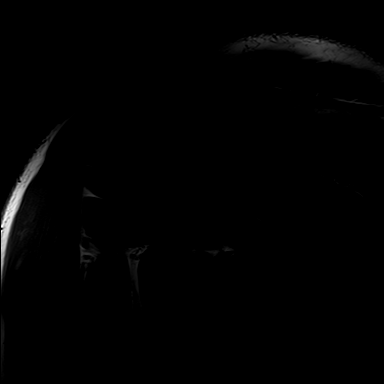
[im 8/20]
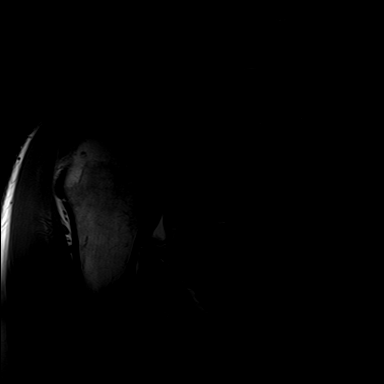
[im 12/20]
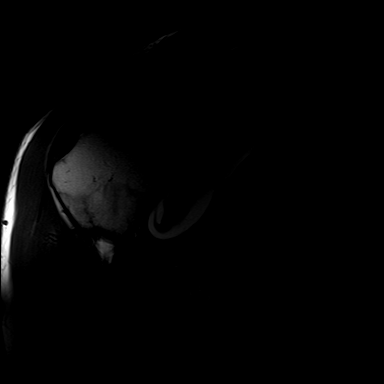
[im 16/20]
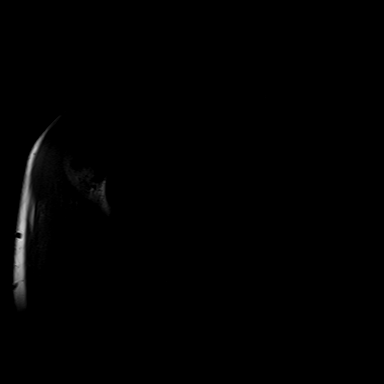
[im 20/20]
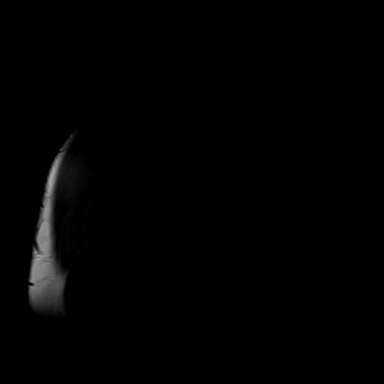

[Series 6: T2 fat-sat · oblique · 4.0mm · 0.62mm/px · 7 of 22 slices shown (2 of 2)]
[im 1/22]
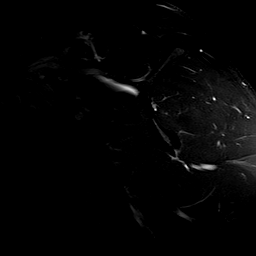
[im 4/22]
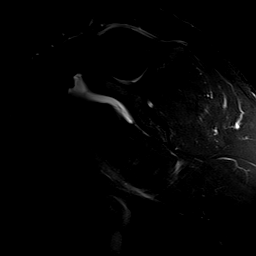
[im 8/22]
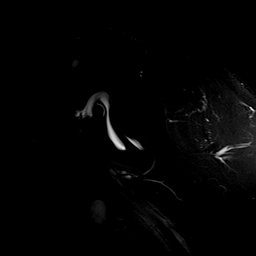
[im 11/22]
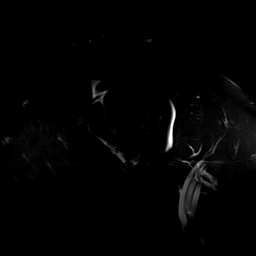
[im 15/22]
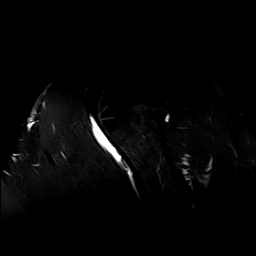
[im 18/22]
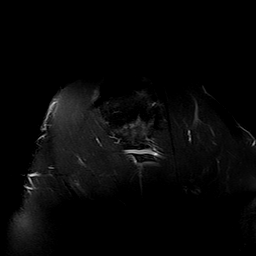
[im 22/22]
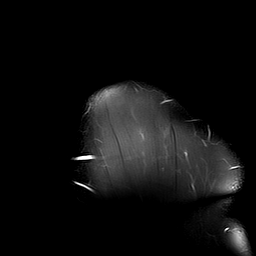

[Series 9: T1 fat-sat · sagittal · 4.0mm · 0.62mm/px · 7 of 22 slices shown (4 of 4)]
[im 1/22]
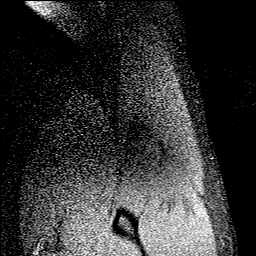
[im 4/22]
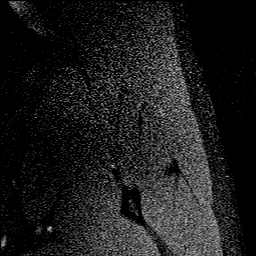
[im 8/22]
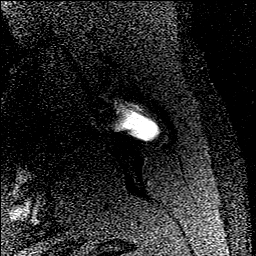
[im 11/22]
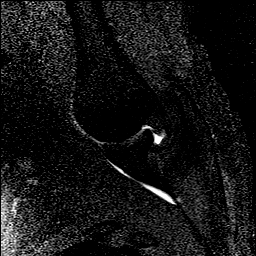
[im 15/22]
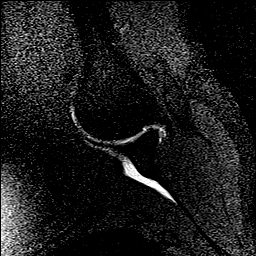
[im 18/22]
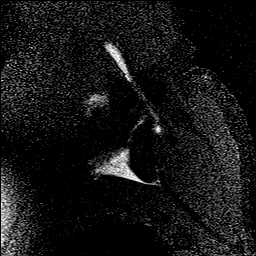
[im 22/22]
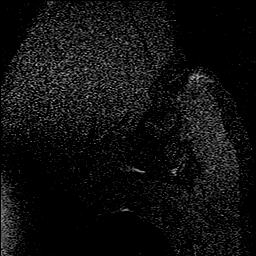

[40 of 40 positions shown; findings below may reference images not displayed]

FINDINGS: Rotator cuff: Mild tendinosis of the supraspinatus tendon.
Infraspinatus tendon is intact. Teres minor tendon is intact.
Subscapularis tendon is intact.

Muscles: No atrophy or fatty replacement of nor abnormal signal
within, the muscles of the rotator cuff.

Biceps long head: Intact.

Acromioclavicular Joint: Mild degenerative changes of the
acromioclavicular joint. Type I acromion. No subacromial/subdeltoid
bursal fluid.

Glenohumeral Joint: Intraarticular contrast normally distending the
joint capsule. No chondral defect.

Labrum: Intact.

Bones: No marrow signal abnormality. No acute fracture or
dislocation.
IMPRESSION: 1. Mild tendinosis of the supraspinatus tendon without a discrete
tear.

## 2016-02-07 IMAGING — RF DG ARTHROGRAM SHOULDER*R*
1 series · 2 of 2 positions shown · non-contrast
Comparison: None.

CLINICAL DATA: Right shoulder pain for 12-24 months.

EXAM:
ARTHROGRAM OF THE RIGHT SHOULDER
FLUOROSCOPY TIME:  Radiation Exposure Index (as provided by the
fluoroscopic device): 3.5 mGy

[Series 1: cp_standard · 0.17mm/px · 2 of 2 slices shown]
[im 1/2]
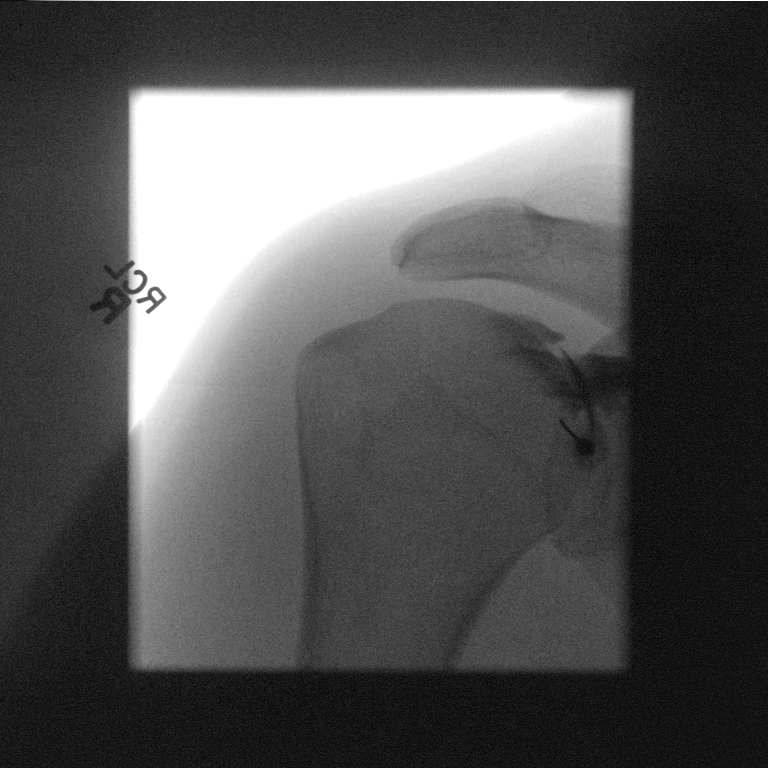
[im 2/2]
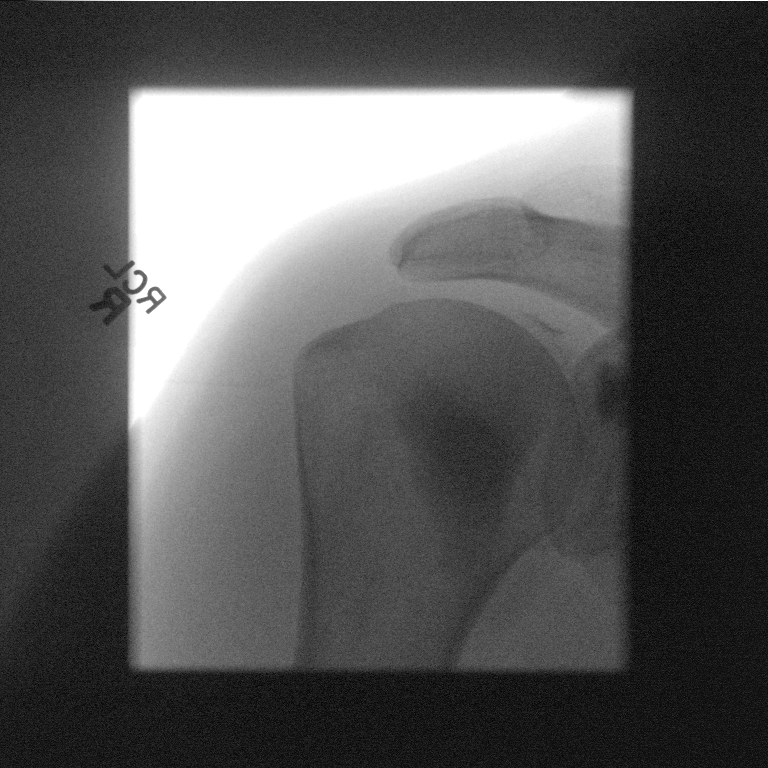

[2 of 2 positions shown; findings below may reference images not displayed]

FINDINGS: The risks and benefits of the procedure were discussed with the
patient, and written informed consent was obtained. The patient
stated no history of allergy to contrast media. A formal timeout
procedure was performed with the patient according to departmental
protocol.

The patient was placed supine on the fluoroscopy table and the right
glenohumeral joint was identified under fluoroscopy. The skin
overlying the right glenohumeral joint was subsequently cleaned with
Chloraprep and a sterile drape was placed over the area of interest.
5 ml 1% Lidocaine was used to anesthetize the skin around the needle
insertion site.

A 22 gauge spinal needle was inserted into the right glenohumeral
joint under fluoroscopy. Position was confirmed with injection of
less than 1ml of Omnipaque 300 under fluoroscopy.

12 ml of gadolinium mixture (0.1 ml of Multihance mixed with 20 ml
of sterile saline) was injected into the right glenohumeral joint.

The needle was removed and hemostasis was achieved. The patient was
subsequently transferred to MRI for imaging.
IMPRESSION: Successful fluoroscopic guided right shoulder arthrogram prior to
MRI.

## 2016-05-20 ENCOUNTER — Other Ambulatory Visit: Payer: Self-pay

## 2016-05-20 ENCOUNTER — Encounter: Payer: Self-pay | Admitting: Gastroenterology

## 2016-05-20 ENCOUNTER — Ambulatory Visit (INDEPENDENT_AMBULATORY_CARE_PROVIDER_SITE_OTHER): Payer: 59 | Admitting: Gastroenterology

## 2016-05-20 VITALS — BP 98/61 | HR 79 | Temp 98.2°F | Ht 69.0 in | Wt 210.8 lb

## 2016-05-20 DIAGNOSIS — K219 Gastro-esophageal reflux disease without esophagitis: Secondary | ICD-10-CM

## 2016-05-20 DIAGNOSIS — R1310 Dysphagia, unspecified: Secondary | ICD-10-CM | POA: Diagnosis not present

## 2016-05-20 NOTE — Progress Notes (Signed)
Gastroenterology Consultation  Referring Provider:     Juline Patch, MD Primary Care Physician:  Otilio Miu, MD Primary Gastroenterologist:  Dr. Allen Norris     Reason for Consultation:     GERD        HPI:   Kurt Patel. is a 40 y.o. y/o male referred for consultation & management of GERD by Dr. Otilio Miu, MD.  This patient comes today with a history of GERD.  The patient states that he has had a sore throat with hoarseness and reports that he wakes up in the middle of night choking from the acid reflux.  The patient started taking Dexilant in the morning and states that his acid reflux now wakes him up much later at night usually about 4 o'clockmorning.  The patient has been has also been sleeping in a recliner because of the reflux. There is no report of any unexplained weight loss.  He does report that sometimes food gets stuck in his esophagus when he eats.  He denies any black stools or bloody stools.  He also denies vomiting up any blood.  Past Medical History  Diagnosis Date  . Wears dentures     full upper  . Skin cancer   . Arthritis     hands, right hip  . Vertigo     (x1) - 4 or 5 yrs ago  . Legg-Calve-Perthes disease     right hip  . GERD (gastroesophageal reflux disease)     Past Surgical History  Procedure Laterality Date  . Hip surgery    . Skin cancer excision      back  . Throat surgery  12/14/2014    polyps removed- benign  . Shoulder arthroscopy Right 09/05/2015    Procedure: ARTHROSCOPY SHOULDER DEBRIEDMENT and decompression;  Surgeon: Corky Mull, MD;  Location: [facility];  Service: Orthopedics;  Laterality: Right;    Prior to Admission medications   Medication Sig Start Date End Date Taking? Authorizing Provider  DEXILANT 60 MG capsule Take 1 capsule by mouth daily. 04/30/16  Yes Historical Provider, MD  calcium carbonate (TUMS - DOSED IN MG ELEMENTAL CALCIUM) 500 MG chewable tablet Chew 1 tablet by mouth as needed for indigestion or  heartburn. Reported on 05/20/2016    Historical Provider, MD  etodolac (LODINE) 500 MG tablet TAKE 1 TABLET (500 MG TOTAL) BY MOUTH 2 (TWO) TIMES DAILY. Patient not taking: Reported on 05/20/2016 08/14/15   Juline Patch, MD  oxyCODONE (ROXICODONE) 5 MG immediate release tablet Take 1-2 tablets (5-10 mg total) by mouth every 4 (four) hours as needed for severe pain. Patient not taking: Reported on 05/20/2016 09/05/15   Corky Mull, MD  pantoprazole (PROTONIX) 40 MG tablet Take 40 mg by mouth 2 (two) times daily. Reported on 05/20/2016    Historical Provider, MD    Family History  Problem Relation Age of Onset  . Diabetes Mother   . Hyperlipidemia Mother   . Hyperlipidemia Father      Social History  Substance Use Topics  . Smoking status: Current Every Day Smoker -- 1.00 packs/day for 20 years    Types: Cigarettes  . Smokeless tobacco: None  . Alcohol Use: 7.2 oz/week    12 Cans of beer per week    Allergies as of 05/20/2016 - Review Complete 05/20/2016  Allergen Reaction Noted  . Buspar [buspirone] Other (See Comments) 05/15/2015  . Prednisone Other (See Comments) 07/05/2015    Review  of Systems:    All systems reviewed and negative except where noted in HPI.   Physical Exam:  BP 98/61 mmHg  Pulse 79  Temp(Src) 98.2 F (36.8 C) (Oral)  Ht 5\' 9"  (1.753 m)  Wt 210 lb 12.8 oz (95.618 kg)  BMI 31.12 kg/m2 No LMP for male patient. Psych:  Alert and cooperative. Normal mood and affect. General:   Alert,  Well-developed, well-nourished, pleasant and cooperative in NAD Head:  Normocephalic and atraumatic. Eyes:  Sclera clear, no icterus.   Conjunctiva pink. Ears:  Normal auditory acuity. Nose:  No deformity, discharge, or lesions. Mouth:  No deformity or lesions,oropharynx pink & moist. Neck:  Supple; no masses or thyromegaly. Lungs:  Respirations even and unlabored.  Clear throughout to auscultation.   No wheezes, crackles, or rhonchi. No acute distress. Heart:  Regular rate  and rhythm; no murmurs, clicks, rubs, or gallops. Abdomen:  Normal bowel sounds.  No bruits.  Soft, non-tender and non-distended without masses, hepatosplenomegaly or hernias noted.  No guarding or rebound tenderness.  Negative Carnett sign.   Rectal:  Deferred.  Msk:  Symmetrical without gross deformities.  Good, equal movement & strength bilaterally. Pulses:  Normal pulses noted. Extremities:  No clubbing or edema.  No cyanosis. Neurologic:  Alert and oriented x3;  grossly normal neurologically. Skin:  Intact without significant lesions or rashes.  No jaundice. Lymph Nodes:  No significant cervical adenopathy. Psych:  Alert and cooperative. Normal mood and affect.  Imaging Studies: No results found.  Assessment and Plan:   Kurt Patel. is a 40 y.o. y/o male who comes in today with a history of long-standing reflux with dysphagia.  The patient is doing better on Dexilant when he takes in the morning but still wakes up in the middle of night to regurgitation and reflux.  The patient has been told to take his Dexilant in the evening so that he has maximum acid suppression while sleeping. The patient will also be set up for a upper endoscopy due to his long-standing reflux and dysphagia. I have discussed risks & benefits which include, but are not limited to, bleeding, infection, perforation & drug reaction.  The patient agrees with this plan & written consent will be obtained.      Note: This dictation was prepared with Dragon dictation along with smaller phrase technology. Any transcriptional errors that result from this process are unintentional.

## 2016-05-26 ENCOUNTER — Ambulatory Visit (INDEPENDENT_AMBULATORY_CARE_PROVIDER_SITE_OTHER): Payer: 59 | Admitting: Family Medicine

## 2016-05-26 ENCOUNTER — Encounter: Payer: Self-pay | Admitting: Family Medicine

## 2016-05-26 VITALS — BP 110/80 | HR 98 | Ht 69.0 in | Wt 211.0 lb

## 2016-05-26 DIAGNOSIS — J219 Acute bronchiolitis, unspecified: Secondary | ICD-10-CM

## 2016-05-26 DIAGNOSIS — J4521 Mild intermittent asthma with (acute) exacerbation: Secondary | ICD-10-CM | POA: Diagnosis not present

## 2016-05-26 MED ORDER — PREDNISONE 5 MG PO TABS: 5.0000 mg | ORAL_TABLET | Freq: Every day | ORAL | Status: DC

## 2016-05-26 MED ORDER — AZITHROMYCIN 250 MG PO TABS: ORAL_TABLET | ORAL | Status: DC

## 2016-05-26 MED ORDER — ALBUTEROL SULFATE (2.5 MG/3ML) 0.083% IN NEBU: 2.5000 mg | INHALATION_SOLUTION | Freq: Four times a day (QID) | RESPIRATORY_TRACT | Status: DC | PRN

## 2016-05-26 MED ORDER — ALBUTEROL SULFATE HFA 108 (90 BASE) MCG/ACT IN AERS: 2.0000 | INHALATION_SPRAY | Freq: Four times a day (QID) | RESPIRATORY_TRACT | Status: DC | PRN

## 2016-05-26 NOTE — Patient Instructions (Signed)
How to Use an Inhaler Proper inhaler technique is very important. Good technique ensures that the medicine reaches the lungs. Poor technique results in depositing the medicine on the tongue and back of the throat rather than in the airways. If you do not use the inhaler with good technique, the medicine will not help you. STEPS TO FOLLOW IF USING AN INHALER WITHOUT AN EXTENSION TUBE 1. Remove the cap from the inhaler. 2. If you are using the inhaler for the first time, you will need to prime it. Shake the inhaler for 5 seconds and release four puffs into the air, away from your face. Ask your health care provider or pharmacist if you have questions about priming your inhaler. 3. Shake the inhaler for 5 seconds before each breath in (inhalation). 4. Position the inhaler so that the top of the canister faces up. 5. Put your index finger on the top of the medicine canister. Your thumb supports the bottom of the inhaler. 6. Open your mouth. 7. Either place the inhaler between your teeth and place your lips tightly around the mouthpiece, or hold the inhaler 1-2 inches away from your open mouth. If you are unsure of which technique to use, ask your health care provider. 8. Breathe out (exhale) normally and as completely as possible. 9. Press the canister down with your index finger to release the medicine. 10. At the same time as the canister is pressed, inhale deeply and slowly until your lungs are completely filled. This should take 4-6 seconds. Keep your tongue down. 11. Hold the medicine in your lungs for 5-10 seconds (10 seconds is best). This helps the medicine get into the small airways of your lungs. 12. Breathe out slowly, through pursed lips. Whistling is an example of pursed lips. 13. Wait at least 15-30 seconds between puffs. Continue with the above steps until you have taken the number of puffs your health care provider has ordered. Do not use the inhaler more than your health care provider  tells you. 14. Replace the cap on the inhaler. 15. Follow the directions from your health care provider or the inhaler insert for cleaning the inhaler. STEPS TO FOLLOW IF USING AN INHALER WITH AN EXTENSION (SPACER) 1. Remove the cap from the inhaler. 2. If you are using the inhaler for the first time, you will need to prime it. Shake the inhaler for 5 seconds and release four puffs into the air, away from your face. Ask your health care provider or pharmacist if you have questions about priming your inhaler. 3. Shake the inhaler for 5 seconds before each breath in (inhalation). 4. Place the open end of the spacer onto the mouthpiece of the inhaler. 5. Position the inhaler so that the top of the canister faces up and the spacer mouthpiece faces you. 6. Put your index finger on the top of the medicine canister. Your thumb supports the bottom of the inhaler and the spacer. 7. Breathe out (exhale) normally and as completely as possible. 8. Immediately after exhaling, place the spacer between your teeth and into your mouth. Close your lips tightly around the spacer. 9. Press the canister down with your index finger to release the medicine. 10. At the same time as the canister is pressed, inhale deeply and slowly until your lungs are completely filled. This should take 4-6 seconds. Keep your tongue down and out of the way. 11. Hold the medicine in your lungs for 5-10 seconds (10 seconds is best). This helps the   medicine get into the small airways of your lungs. Exhale. °12. Repeat inhaling deeply through the spacer mouthpiece. Again hold that breath for up to 10 seconds (10 seconds is best). Exhale slowly. If it is difficult to take this second deep breath through the spacer, breathe normally several times through the spacer. Remove the spacer from your mouth. °13. Wait at least 15-30 seconds between puffs. Continue with the above steps until you have taken the number of puffs your health care provider has  ordered. Do not use the inhaler more than your health care provider tells you. °14. Remove the spacer from the inhaler, and place the cap on the inhaler. °15. Follow the directions from your health care provider or the inhaler insert for cleaning the inhaler and spacer. °If you are using different kinds of inhalers, use your quick relief medicine to open the airways 10-15 minutes before using a steroid if instructed to do so by your health care provider. If you are unsure which inhalers to use and the order of using them, ask your health care provider, nurse, or respiratory therapist. °If you are using a steroid inhaler, always rinse your mouth with water after your last puff, then gargle and spit out the water. Do not swallow the water. °AVOID: °· Inhaling before or after starting the spray of medicine. It takes practice to coordinate your breathing with triggering the spray. °· Inhaling through the nose (rather than the mouth) when triggering the spray. °HOW TO DETERMINE IF YOUR INHALER IS FULL OR NEARLY EMPTY °You cannot know when an inhaler is empty by shaking it. A few inhalers are now being made with dose counters. Ask your health care provider for a prescription that has a dose counter if you feel you need that extra help. If your inhaler does not have a counter, ask your health care provider to help you determine the date you need to refill your inhaler. Write the refill date on a calendar or your inhaler canister. Refill your inhaler 7-10 days before it runs out. Be sure to keep an adequate supply of medicine. This includes making sure it is not expired, and that you have a spare inhaler.  °SEEK MEDICAL CARE IF:  °· Your symptoms are only partially relieved with your inhaler. °· You are having trouble using your inhaler. °· You have some increase in phlegm. °SEEK IMMEDIATE MEDICAL CARE IF:  °· You feel little or no relief with your inhalers. You are still wheezing and are feeling shortness of breath or  tightness in your chest or both. °· You have dizziness, headaches, or a fast heart rate. °· You have chills, fever, or night sweats. °· You have a noticeable increase in phlegm production, or there is blood in the phlegm. °MAKE SURE YOU:  °· Understand these instructions. °· Will watch your condition. °· Will get help right away if you are not doing well or get worse. °  °This information is not intended to replace advice given to you by your health care provider. Make sure you discuss any questions you have with your health care provider. °  °Document Released: 11/21/2000 Document Revised: 09/14/2013 Document Reviewed: 06/23/2013 °Elsevier Interactive Patient Education ©2016 [employer] ° °

## 2016-05-26 NOTE — Progress Notes (Signed)
Name: Kurt Patel.   MRN: [id number]    DOB: 05-Nov-1976   Date:05/26/2016       Progress Note  Subjective  Chief Complaint  Chief Complaint  Patient presents with  . Shortness of Breath    cough- clear production x 2 days, loss voice x 2 days    Shortness of Breath This is a new problem. The current episode started in the past 7 days. The problem occurs every few minutes. The problem has been gradually worsening. The average episode lasts 3 days. Associated symptoms include sputum production and wheezing. Pertinent negatives include no abdominal pain, chest pain, coryza, ear pain, fever, headaches, hemoptysis, leg pain (3), leg swelling, neck pain, orthopnea, PND, rash, rhinorrhea (cough), sore throat, swollen glands or syncope. The symptoms are aggravated by smoke and URIs. He has tried nothing for the symptoms. The treatment provided mild relief.  Cough This is a recurrent problem. The current episode started in the past 7 days. The problem occurs constantly. The cough is productive of sputum. Associated symptoms include shortness of breath and wheezing. Pertinent negatives include no chest pain, chills, ear pain, fever, headaches, heartburn, hemoptysis, myalgias, nasal congestion, postnasal drip, rash, rhinorrhea (cough), sore throat or weight loss. Exacerbated by: smoking. Risk factors for lung disease include smoking/tobacco exposure. He has tried nothing for the symptoms. The treatment provided no relief. There is no history of environmental allergies.    No problem-specific assessment & plan notes found for this encounter.   Past Medical History  Diagnosis Date  . Wears dentures     full upper  . Skin cancer   . Arthritis     hands, right hip  . Vertigo     (x1) - 4 or 5 yrs ago  . Legg-Calve-Perthes disease     right hip  . GERD (gastroesophageal reflux disease)     Past Surgical History  Procedure Laterality Date  . Hip surgery    . Skin cancer excision      back   . Throat surgery  12/14/2014    polyps removed- benign  . Shoulder arthroscopy Right 09/05/2015    Procedure: ARTHROSCOPY SHOULDER DEBRIEDMENT and decompression;  Surgeon: Corky Mull, MD;  Location: [facility];  Service: Orthopedics;  Laterality: Right;    Family History  Problem Relation Age of Onset  . Diabetes Mother   . Hyperlipidemia Mother   . Hyperlipidemia Father     Social History   Social History  . Marital Status: Married    Spouse Name: N/A  . Number of Children: N/A  . Years of Education: N/A   Occupational History  . Not on file.   Social History Main Topics  . Smoking status: Current Every Day Smoker -- 1.00 packs/day for 20 years    Types: Cigarettes  . Smokeless tobacco: Not on file  . Alcohol Use: 7.2 oz/week    12 Cans of beer per week  . Drug Use: Not on file  . Sexual Activity: Yes   Other Topics Concern  . Not on file   Social History Narrative    Allergies  Allergen Reactions  . Buspar [Buspirone] Other (See Comments)    "loopy"  . Prednisone Other (See Comments)    "makes me angry"     Review of Systems  Constitutional: Negative for fever, chills, weight loss and malaise/fatigue.  HENT: Negative for ear discharge, ear pain, postnasal drip, rhinorrhea (cough) and sore throat.   Eyes: Negative for  blurred vision.  Respiratory: Positive for cough, sputum production, shortness of breath and wheezing. Negative for hemoptysis.   Cardiovascular: Negative for chest pain, palpitations, orthopnea, leg swelling, syncope and PND.  Gastrointestinal: Negative for heartburn, nausea, abdominal pain, diarrhea, constipation, blood in stool and melena.  Genitourinary: Negative for dysuria, urgency, frequency and hematuria.  Musculoskeletal: Negative for myalgias, back pain, joint pain and neck pain.  Skin: Negative for rash.  Neurological: Negative for dizziness, tingling, sensory change, focal weakness and headaches.  Endo/Heme/Allergies:  Negative for environmental allergies and polydipsia. Does not bruise/bleed easily.  Psychiatric/Behavioral: Negative for depression and suicidal ideas. The patient is not nervous/anxious and does not have insomnia.      Objective  Filed Vitals:   05/26/16 0800  BP: 110/80  Pulse: 98  Height: 5\' 9"  (1.753 m)  Weight: 211 lb (95.709 kg)  SpO2: 99%    Physical Exam  Constitutional: He is oriented to person, place, and time and well-developed, well-nourished, and in no distress.  HENT:  Head: Normocephalic.  Right Ear: External ear normal.  Left Ear: External ear normal.  Nose: Nose normal.  Mouth/Throat: Oropharynx is clear and moist.  Eyes: Conjunctivae and EOM are normal. Pupils are equal, round, and reactive to light. Right eye exhibits no discharge. Left eye exhibits no discharge. No scleral icterus.  Neck: Normal range of motion. Neck supple. No JVD present. No tracheal deviation present. No thyromegaly present.  Cardiovascular: Normal rate, regular rhythm, normal heart sounds and intact distal pulses.  Exam reveals no gallop and no friction rub.   No murmur heard. Pulmonary/Chest: No respiratory distress. He has wheezes. He has no rales.  Abdominal: Soft. Bowel sounds are normal. He exhibits no mass. There is no hepatosplenomegaly. There is no tenderness. There is no rebound, no guarding and no CVA tenderness.  Musculoskeletal: Normal range of motion. He exhibits no edema or tenderness.  Lymphadenopathy:    He has no cervical adenopathy.  Neurological: He is alert and oriented to person, place, and time. He has normal sensation, normal strength, normal reflexes and intact cranial nerves. No cranial nerve deficit.  Skin: Skin is warm. No rash noted.  Psychiatric: Mood and affect normal.  Nursing note and vitals reviewed.     Assessment & Plan  Problem List Items Addressed This Visit    None    Visit Diagnoses    Asthma, mild intermittent, with acute exacerbation    -   Primary    Relevant Medications    albuterol (PROVENTIL HFA;VENTOLIN HFA) 108 (90 Base) MCG/ACT inhaler    predniSONE (DELTASONE) 5 MG tablet    albuterol (PROVENTIL) (2.5 MG/3ML) 0.083% nebulizer solution    Bronchiolitis        Relevant Medications    azithromycin (ZITHROMAX) 250 MG tablet         Dr. Latasha Puskas [facility]  05/26/2016

## 2016-05-29 ENCOUNTER — Encounter: Payer: Self-pay | Admitting: *Deleted

## 2016-06-04 NOTE — Discharge Instructions (Signed)

## 2016-06-05 ENCOUNTER — Ambulatory Visit
Admission: RE | Admit: 2016-06-05 | Discharge: 2016-06-05 | Disposition: A | Discharge status: Home / Self Care | Payer: 59 | Source: Ambulatory Visit | Attending: Gastroenterology | Admitting: Gastroenterology

## 2016-06-05 ENCOUNTER — Ambulatory Visit: Payer: 59 | Admitting: Anesthesiology

## 2016-06-05 ENCOUNTER — Encounter
Admission: RE | Disposition: A | Discharge status: Home / Self Care | Payer: Self-pay | Source: Ambulatory Visit | Attending: Gastroenterology

## 2016-06-05 DIAGNOSIS — F17200 Nicotine dependence, unspecified, uncomplicated: Secondary | ICD-10-CM | POA: Diagnosis not present

## 2016-06-05 DIAGNOSIS — M13841 Other specified arthritis, right hand: Secondary | ICD-10-CM | POA: Diagnosis not present

## 2016-06-05 DIAGNOSIS — Z79899 Other long term (current) drug therapy: Secondary | ICD-10-CM | POA: Insufficient documentation

## 2016-06-05 DIAGNOSIS — R12 Heartburn: Secondary | ICD-10-CM | POA: Diagnosis not present

## 2016-06-05 DIAGNOSIS — M13842 Other specified arthritis, left hand: Secondary | ICD-10-CM | POA: Diagnosis not present

## 2016-06-05 DIAGNOSIS — K219 Gastro-esophageal reflux disease without esophagitis: Secondary | ICD-10-CM | POA: Insufficient documentation

## 2016-06-05 DIAGNOSIS — Z85828 Personal history of other malignant neoplasm of skin: Secondary | ICD-10-CM | POA: Diagnosis not present

## 2016-06-05 DIAGNOSIS — M9111 Juvenile osteochondrosis of head of femur [Legg-Calve-Perthes], right leg: Secondary | ICD-10-CM | POA: Diagnosis not present

## 2016-06-05 DIAGNOSIS — M13851 Other specified arthritis, right hip: Secondary | ICD-10-CM | POA: Diagnosis not present

## 2016-06-05 DIAGNOSIS — Z888 Allergy status to other drugs, medicaments and biological substances status: Secondary | ICD-10-CM | POA: Insufficient documentation

## 2016-06-05 DIAGNOSIS — F17210 Nicotine dependence, cigarettes, uncomplicated: Secondary | ICD-10-CM | POA: Insufficient documentation

## 2016-06-05 HISTORY — PX: ESOPHAGOGASTRODUODENOSCOPY (EGD) WITH PROPOFOL: SHX5813

## 2016-06-05 SURGERY — ESOPHAGOGASTRODUODENOSCOPY (EGD) WITH PROPOFOL
Anesthesia: Monitor Anesthesia Care | Wound class: Clean Contaminated

## 2016-06-05 MED ORDER — GLYCOPYRROLATE 0.2 MG/ML IJ SOLN
INTRAMUSCULAR | Status: DC | PRN
  Administered 2016-06-05: 0.1 mg via INTRAVENOUS

## 2016-06-05 MED ORDER — LACTATED RINGERS IV SOLN
INTRAVENOUS | Status: DC
  Administered 2016-06-05: 08:00:00 via INTRAVENOUS

## 2016-06-05 MED ORDER — STERILE WATER FOR IRRIGATION IR SOLN
Status: DC | PRN
  Administered 2016-06-05: 09:00:00

## 2016-06-05 MED ORDER — ALBUTEROL SULFATE HFA 108 (90 BASE) MCG/ACT IN AERS: 2.0000 | INHALATION_SPRAY | Freq: Once | RESPIRATORY_TRACT | Status: DC

## 2016-06-05 MED ORDER — ACETAMINOPHEN 160 MG/5ML PO SOLN: 325.0000 mg | ORAL | Status: DC | PRN

## 2016-06-05 MED ORDER — LIDOCAINE HCL (CARDIAC) 20 MG/ML IV SOLN
INTRAVENOUS | Status: DC | PRN
  Administered 2016-06-05: 50 mg via INTRAVENOUS

## 2016-06-05 MED ORDER — ACETAMINOPHEN 325 MG PO TABS: 325.0000 mg | ORAL_TABLET | ORAL | Status: DC | PRN

## 2016-06-05 MED ORDER — PROPOFOL 10 MG/ML IV BOLUS
INTRAVENOUS | Status: DC | PRN
  Administered 2016-06-05: 100 mg via INTRAVENOUS
  Administered 2016-06-05 (×2): 50 mg via INTRAVENOUS

## 2016-06-05 SURGICAL SUPPLY — 32 items
BALLN DILATOR 10-12 8 (BALLOONS)
BALLN DILATOR 12-15 8 (BALLOONS)
BALLN DILATOR 15-18 8 (BALLOONS)
BALLN DILATOR CRE 0-12 8 (BALLOONS)
BALLN DILATOR ESOPH 8 10 CRE (MISCELLANEOUS) IMPLANT
BALLOON DILATOR 12-15 8 (BALLOONS) IMPLANT
BALLOON DILATOR 15-18 8 (BALLOONS) IMPLANT
BALLOON DILATOR CRE 0-12 8 (BALLOONS) IMPLANT
BLOCK BITE 60FR ADLT L/F GRN (MISCELLANEOUS) ×3 IMPLANT
CANISTER SUCT 1200ML W/VALVE (MISCELLANEOUS) ×3 IMPLANT
CLIP HMST 235XBRD CATH ROT (MISCELLANEOUS) IMPLANT
CLIP RESOLUTION 360 11X235 (MISCELLANEOUS)
FCP ESCP3.2XJMB 240X2.8X (MISCELLANEOUS)
FORCEPS BIOP RAD 4 LRG CAP 4 (CUTTING FORCEPS) IMPLANT
FORCEPS BIOP RJ4 240 W/NDL (MISCELLANEOUS)
FORCEPS ESCP3.2XJMB 240X2.8X (MISCELLANEOUS) IMPLANT
GOWN CVR UNV OPN BCK APRN NK (MISCELLANEOUS) ×2 IMPLANT
GOWN ISOL THUMB LOOP REG UNIV (MISCELLANEOUS) ×4
INJECTOR VARIJECT VIN23 (MISCELLANEOUS) IMPLANT
KIT DEFENDO VALVE AND CONN (KITS) IMPLANT
KIT ENDO PROCEDURE OLY (KITS) ×3 IMPLANT
MARKER SPOT ENDO TATTOO 5ML (MISCELLANEOUS) IMPLANT
PAD GROUND ADULT SPLIT (MISCELLANEOUS) IMPLANT
RETRIEVER NET PLAT FOOD (MISCELLANEOUS) IMPLANT
SNARE SHORT THROW 13M SML OVAL (MISCELLANEOUS) IMPLANT
SNARE SHORT THROW 30M LRG OVAL (MISCELLANEOUS) IMPLANT
SPOT EX ENDOSCOPIC TATTOO (MISCELLANEOUS)
SYR INFLATION 60ML (SYRINGE) IMPLANT
TRAP ETRAP POLY (MISCELLANEOUS) IMPLANT
VARIJECT INJECTOR VIN23 (MISCELLANEOUS)
WATER STERILE IRR 250ML POUR (IV SOLUTION) ×3 IMPLANT
WIRE CRE 18-20MM 8CM F G (MISCELLANEOUS) IMPLANT

## 2016-06-05 NOTE — Op Note (Signed)
[facility] Gastroenterology Patient Name: Kurt Patel Procedure Date: 06/05/2016 8:43 AM MRN: [id number] Account #: [id number] Date of Birth: [birth year] Admit Type: Outpatient Age: 40 Room: [facility] OR ROOM 01 Gender: Male Note Status: Finalized Procedure:            Upper GI endoscopy Indications:          Heartburn Providers:            [name], MD Referring MD:         Juline Patch, MD (Referring MD) Medicines:            Propofol per Anesthesia Complications:        No immediate complications. Procedure:            Pre-Anesthesia Assessment:                       - Prior to the procedure, a History and Physical was                        performed, and patient medications and allergies were                        reviewed. The patient's tolerance of previous                        anesthesia was also reviewed. The risks and benefits of                        the procedure and the sedation options and risks were                        discussed with the patient. All questions were                        answered, and informed consent was obtained. Prior                        Anticoagulants: The patient has taken no previous                        anticoagulant or antiplatelet agents. ASA Grade                        Assessment: II - A patient with mild systemic disease.                        After reviewing the risks and benefits, the patient was                        deemed in satisfactory condition to undergo the                        procedure.                       After obtaining informed consent, the endoscope was                        passed under direct vision. Throughout the procedure,  the patient's blood pressure, pulse, and oxygen                        saturations were monitored continuously. The was                        introduced through the mouth, and advanced to the                        second part of  duodenum. The upper GI endoscopy was                        accomplished without difficulty. The patient tolerated                        the procedure well. Findings:      The examined esophagus was normal.      The entire examined stomach was normal.      The examined duodenum was normal. Impression:           - Normal esophagus.                       - Normal stomach.                       - Normal examined duodenum.                       - No specimens collected. Recommendation:       - Continue present medications. Procedure Code(s):    --- Professional ---                       [id number], Esophagogastroduodenoscopy, flexible, transoral;                        diagnostic, including collection of specimen(s) by                        brushing or washing, when performed (separate procedure) Diagnosis Code(s):    --- Professional ---                       R12, Heartburn CPT copyright 2016 American Medical Association. All rights reserved. The codes documented in this report are preliminary and upon coder review may  be revised to meet current compliance requirements. [name], MD 06/05/2016 8:54:14 AM This report has been signed electronically. Number of Addenda: 0 Note Initiated On: 06/05/2016 8:43 AM Total Procedure Duration: 0 hours 2 minutes 19 seconds       [facility]

## 2016-06-05 NOTE — Anesthesia Postprocedure Evaluation (Signed)
Anesthesia Post Note  Patient: Kurt Patel.  Procedure(s) Performed: Procedure(s) (LRB): ESOPHAGOGASTRODUODENOSCOPY (EGD) WITH PROPOFOL (N/A)  Patient location during evaluation: PACU Anesthesia Type: MAC Level of consciousness: awake and alert and oriented Pain management: satisfactory to patient Vital Signs Assessment: post-procedure vital signs reviewed and stable Respiratory status: spontaneous breathing, nonlabored ventilation and respiratory function stable Cardiovascular status: blood pressure returned to baseline and stable Postop Assessment: Adequate PO intake and No signs of nausea or vomiting Anesthetic complications: no    Raliegh Ip

## 2016-06-05 NOTE — Transfer of Care (Signed)
Immediate Anesthesia Transfer of Care Note  Patient: Kurt Patel.  Procedure(s) Performed: Procedure(s): ESOPHAGOGASTRODUODENOSCOPY (EGD) WITH PROPOFOL (N/A)  Patient Location: PACU  Anesthesia Type: MAC  Level of Consciousness: awake, alert  and patient cooperative  Airway and Oxygen Therapy: Patient Spontanous Breathing and Patient connected to supplemental oxygen  Post-op Assessment: Post-op Vital signs reviewed, Patient's Cardiovascular Status Stable, Respiratory Function Stable, Patent Airway and No signs of Nausea or vomiting  Post-op Vital Signs: Reviewed and stable  Complications: No apparent anesthesia complications

## 2016-06-05 NOTE — Anesthesia Preprocedure Evaluation (Addendum)
Anesthesia Evaluation  Patient identified by MRN, date of birth, ID band  Reviewed: Allergy & Precautions, H&P , NPO status , Patient's Chart, lab work & pertinent test results  Airway Mallampati: II  TM Distance: >3 FB Neck ROM: full    Dental  (+) Edentulous Upper   Pulmonary Current Smoker,    Pulmonary exam normal        Cardiovascular  Rhythm:regular Rate:Normal     Neuro/Psych    GI/Hepatic GERD  ,  Endo/Other    Renal/GU      Musculoskeletal   Abdominal   Peds  Hematology   Anesthesia Other Findings Recent bronchitis.  Lungs clear.  Will pre-treat with albuterol.  Reproductive/Obstetrics                            Anesthesia Physical Anesthesia Plan  ASA: II  Anesthesia Plan: MAC   Post-op Pain Management:    Induction:   Airway Management Planned:   Additional Equipment:   Intra-op Plan:   Post-operative Plan:   Informed Consent: I have reviewed the patients History and Physical, chart, labs and discussed the procedure including the risks, benefits and alternatives for the proposed anesthesia with the patient or authorized representative who has indicated his/her understanding and acceptance.     Plan Discussed with: CRNA  Anesthesia Plan Comments:         Anesthesia Quick Evaluation

## 2016-06-05 NOTE — H&P (Signed)
[name], MD [address] [place], [place] [address] Phone: [phone] Fax : [phone]  Primary Care Physician:  Otilio Miu, MD Primary Gastroenterologist:  Dr. Allen Norris  Pre-Procedure History & Physical: HPI:  Kurt Patel. is a 40 y.o. male is here for an endoscopy.   Past Medical History  Diagnosis Date  . Wears dentures     full upper  . Skin cancer   . Arthritis     hands, right hip  . Vertigo     (x1) - 4 or 5 yrs ago  . Legg-Calve-Perthes disease     right hip  . GERD (gastroesophageal reflux disease)     Past Surgical History  Procedure Laterality Date  . Hip surgery    . Skin cancer excision      back  . Throat surgery  12/14/2014    polyps removed- benign  . Shoulder arthroscopy Right 09/05/2015    Procedure: ARTHROSCOPY SHOULDER DEBRIEDMENT and decompression;  Surgeon: Corky Mull, MD;  Location: [facility];  Service: Orthopedics;  Laterality: Right;    Prior to Admission medications   Medication Sig Start Date End Date Taking? Authorizing Provider  albuterol (PROVENTIL HFA;VENTOLIN HFA) 108 (90 Base) MCG/ACT inhaler Inhale 2 puffs into the lungs every 6 (six) hours as needed for wheezing or shortness of breath. 05/26/16  Yes Juline Patch, MD  albuterol (PROVENTIL) (2.5 MG/3ML) 0.083% nebulizer solution Take 3 mLs (2.5 mg total) by nebulization every 6 (six) hours as needed for wheezing or shortness of breath. 05/26/16  Yes Juline Patch, MD  DEXILANT 60 MG capsule Take 1 capsule by mouth daily. 04/30/16  Yes Historical Provider, MD  azithromycin (ZITHROMAX) 250 MG tablet 2 today then 1 a day for 4 days Patient not taking: Reported on 06/05/2016 05/26/16   Juline Patch, MD  calcium carbonate (TUMS - DOSED IN MG ELEMENTAL CALCIUM) 500 MG chewable tablet Chew 1 tablet by mouth as needed for indigestion or heartburn. Reported on 05/29/2016    Historical Provider, MD  etodolac (LODINE) 500 MG tablet TAKE 1 TABLET (500 MG TOTAL) BY  MOUTH 2 (TWO) TIMES DAILY. Patient not taking: Reported on 05/20/2016 08/14/15   Juline Patch, MD  predniSONE (DELTASONE) 5 MG tablet Take 1 tablet (5 mg total) by mouth daily with breakfast. Patient not taking: Reported on 06/05/2016 05/26/16   Juline Patch, MD    Allergies as of 05/20/2016 - Review Complete 05/20/2016  Allergen Reaction Noted  . Buspar [buspirone] Other (See Comments) 05/15/2015  . Prednisone Other (See Comments) 07/05/2015    Family History  Problem Relation Age of Onset  . Diabetes Mother   . Hyperlipidemia Mother   . Hyperlipidemia Father   . Stroke Father     Social History   Social History  . Marital Status: Married    Spouse Name: N/A  . Number of Children: N/A  . Years of Education: N/A   Occupational History  . Not on file.   Social History Main Topics  . Smoking status: Current Every Day Smoker -- 1.00 packs/day for 20 years    Types: Cigarettes  . Smokeless tobacco: Not on file  . Alcohol Use: 7.2 oz/week    12 Cans of beer per week  . Drug Use: Not on file  . Sexual Activity: Yes   Other Topics Concern  . Not on file   Social History Narrative    Review of Systems: See HPI, otherwise  negative ROS  Physical Exam: BP 114/75 mmHg  Pulse 61  Temp(Src) 97.9 F (36.6 C) (Temporal)  Ht 5\' 9"  (1.753 m)  Wt 206 lb (93.441 kg)  BMI 30.41 kg/m2  SpO2 96% General:   Alert,  pleasant and cooperative in NAD Head:  Normocephalic and atraumatic. Neck:  Supple; no masses or thyromegaly. Lungs:  Clear throughout to auscultation.    Heart:  Regular rate and rhythm. Abdomen:  Soft, nontender and nondistended. Normal bowel sounds, without guarding, and without rebound.   Neurologic:  Alert and  oriented x4;  grossly normal neurologically.  Impression/Plan: [name] is here for an endoscopy to be performed for GERD  Risks, benefits, limitations, and alternatives regarding  endoscopy have been reviewed with the patient.  Questions  have been answered.  All parties agreeable.   [name], MD  06/05/2016, 8:00 AM

## 2016-06-05 NOTE — Anesthesia Procedure Notes (Signed)
Procedure Name: MAC Date/Time: 06/05/2016 8:44 AM Performed by: Cameron Ali Pre-anesthesia Checklist: Patient identified, Emergency Drugs available, Suction available, Timeout performed and Patient being monitored Patient Re-evaluated:Patient Re-evaluated prior to inductionOxygen Delivery Method: Nasal cannula Placement Confirmation: positive ETCO2

## 2016-06-06 ENCOUNTER — Encounter: Payer: Self-pay | Admitting: Gastroenterology

## 2016-11-10 ENCOUNTER — Ambulatory Visit (INDEPENDENT_AMBULATORY_CARE_PROVIDER_SITE_OTHER): Payer: 59 | Admitting: Family Medicine

## 2016-11-10 ENCOUNTER — Other Ambulatory Visit: Payer: Self-pay | Admitting: *Deleted

## 2016-11-10 ENCOUNTER — Encounter: Payer: Self-pay | Admitting: Family Medicine

## 2016-11-10 VITALS — BP 118/82 | HR 82 | Ht 69.0 in | Wt 225.0 lb

## 2016-11-10 DIAGNOSIS — J40 Bronchitis, not specified as acute or chronic: Secondary | ICD-10-CM | POA: Diagnosis not present

## 2016-11-10 DIAGNOSIS — R509 Fever, unspecified: Secondary | ICD-10-CM | POA: Diagnosis not present

## 2016-11-10 LAB — POCT INFLUENZA A/B
Influenza A, POC: NEGATIVE
Influenza B, POC: NEGATIVE

## 2016-11-10 MED ORDER — GUAIFENESIN-CODEINE 100-10 MG/5ML PO SYRP: 5.0000 mL | ORAL_SOLUTION | Freq: Three times a day (TID) | ORAL | 0 refills | Status: DC | PRN

## 2016-11-10 MED ORDER — AMOXICILLIN 500 MG PO CAPS: 500.0000 mg | ORAL_CAPSULE | Freq: Three times a day (TID) | ORAL | 0 refills | Status: DC

## 2016-11-10 NOTE — Progress Notes (Signed)
Name: Kurt Patel.   MRN: [id number]    DOB: 01/21/1976   Date:11/10/2016       Progress Note  Subjective  Chief Complaint  Chief Complaint  Patient presents with  . Cough    Pt stated having coughing, congested, aching, fatigue for 4 days    Cough  This is a new problem. The current episode started in the past 7 days. The problem has been gradually worsening. The problem occurs every few minutes. The cough is non-productive. Associated symptoms include chills, a fever, headaches, myalgias, nasal congestion, a sore throat and sweats. Pertinent negatives include no chest pain, ear congestion, ear pain, heartburn, hemoptysis, postnasal drip, rash, rhinorrhea, shortness of breath, weight loss or wheezing. Risk factors for lung disease include smoking/tobacco exposure. He has tried OTC cough suppressant for the symptoms. The treatment provided no relief. There is no history of asthma, COPD, environmental allergies or pneumonia.    No problem-specific Assessment & Plan notes found for this encounter.   Past Medical History:  Diagnosis Date  . Arthritis    hands, right hip  . GERD (gastroesophageal reflux disease)   . Legg-Calve-Perthes disease    right hip  . Skin cancer   . Vertigo    (x1) - 4 or 5 yrs ago  . Wears dentures    full upper    Past Surgical History:  Procedure Laterality Date  . ESOPHAGOGASTRODUODENOSCOPY (EGD) WITH PROPOFOL N/A 06/05/2016   Procedure: ESOPHAGOGASTRODUODENOSCOPY (EGD) WITH PROPOFOL;  Surgeon: [name], MD;  Location: [place];  Service: Endoscopy;  Laterality: N/A;  . HIP SURGERY    . SHOULDER ARTHROSCOPY Right 09/05/2015   Procedure: ARTHROSCOPY SHOULDER DEBRIEDMENT and decompression;  Surgeon: Corky Mull, MD;  Location: [place];  Service: Orthopedics;  Laterality: Right;  . SKIN CANCER EXCISION     back  . THROAT SURGERY  12/14/2014   polyps removed- benign    Family History  Problem Relation Age of Onset  .  Diabetes Mother   . Hyperlipidemia Mother   . Hyperlipidemia Father   . Stroke Father     Social History   Social History  . Marital status: Married    Spouse name: N/A  . Number of children: N/A  . Years of education: N/A   Occupational History  . Not on file.   Social History Main Topics  . Smoking status: Current Every Day Smoker    Packs/day: 1.00    Years: 20.00    Types: Cigarettes  . Smokeless tobacco: Not on file  . Alcohol use 7.2 oz/week    12 Cans of beer per week  . Drug use: Unknown  . Sexual activity: Yes   Other Topics Concern  . Not on file   Social History Narrative  . No narrative on file    Allergies  Allergen Reactions  . Buspar [Buspirone] Other (See Comments)    "loopy"  . Prednisone Other (See Comments)    "makes me angry"     Review of Systems  Constitutional: Positive for chills and fever. Negative for malaise/fatigue and weight loss.  HENT: Positive for sore throat. Negative for ear discharge, ear pain, postnasal drip and rhinorrhea.   Eyes: Negative for blurred vision.  Respiratory: Positive for cough. Negative for hemoptysis, sputum production, shortness of breath and wheezing.   Cardiovascular: Negative for chest pain, palpitations and leg swelling.  Gastrointestinal: Negative for abdominal pain, blood in stool, constipation, diarrhea, heartburn, melena and nausea.  Genitourinary: Negative for dysuria, frequency, hematuria and urgency.  Musculoskeletal: Positive for myalgias. Negative for back pain, joint pain and neck pain.  Skin: Negative for rash.  Neurological: Positive for headaches. Negative for dizziness, tingling, sensory change and focal weakness.  Endo/Heme/Allergies: Negative for environmental allergies and polydipsia. Does not bruise/bleed easily.  Psychiatric/Behavioral: Negative for depression and suicidal ideas. The patient is not nervous/anxious and does not have insomnia.      Objective  Vitals:   11/10/16  1457  BP: 118/82  Pulse: 82  Weight: 225 lb (102.1 kg)  Height: 5\' 9"  (1.753 m)    Physical Exam  Constitutional: He is oriented to person, place, and time and well-developed, well-nourished, and in no distress.  HENT:  Head: Normocephalic.  Right Ear: External ear normal.  Left Ear: External ear normal.  Nose: Nose normal.  Mouth/Throat: Oropharynx is clear and moist.  Eyes: Conjunctivae and EOM are normal. Pupils are equal, round, and reactive to light. Right eye exhibits no discharge. Left eye exhibits no discharge. No scleral icterus.  Neck: Normal range of motion. Neck supple. No JVD present. No tracheal deviation present. No thyromegaly present.  Cardiovascular: Normal rate, regular rhythm, normal heart sounds and intact distal pulses.  Exam reveals no gallop and no friction rub.   No murmur heard. Pulmonary/Chest: Breath sounds normal. No respiratory distress. He has no wheezes. He has no rales.  Abdominal: Soft. Bowel sounds are normal. He exhibits no mass. There is no hepatosplenomegaly. There is no tenderness. There is no rebound, no guarding and no CVA tenderness.  Musculoskeletal: Normal range of motion. He exhibits no edema or tenderness.  Lymphadenopathy:    He has no cervical adenopathy.  Neurological: He is alert and oriented to person, place, and time. He has normal sensation, normal strength, normal reflexes and intact cranial nerves. No cranial nerve deficit.  Skin: Skin is warm. No rash noted.  Psychiatric: Mood and affect normal.  Nursing note and vitals reviewed.     Assessment & Plan  Problem List Items Addressed This Visit    None    Visit Diagnoses    Bronchitis    -  Primary   Relevant Medications   amoxicillin (AMOXIL) 500 MG capsule   Fever, unspecified fever cause       Relevant Orders   POCT Influenza A/B        Dr. [facility]  11/10/16

## 2017-02-17 ENCOUNTER — Ambulatory Visit
Admission: EM | Admit: 2017-02-17 | Discharge: 2017-02-17 | Disposition: A | Discharge status: Home / Self Care | Payer: 59 | Attending: Family Medicine | Admitting: Family Medicine

## 2017-02-17 DIAGNOSIS — J029 Acute pharyngitis, unspecified: Secondary | ICD-10-CM

## 2017-02-17 LAB — RAPID STREP SCREEN (MED CTR MEBANE ONLY): STREPTOCOCCUS, GROUP A SCREEN (DIRECT): NEGATIVE

## 2017-02-17 MED ORDER — AMOXICILLIN 875 MG PO TABS: 875.0000 mg | ORAL_TABLET | Freq: Two times a day (BID) | ORAL | 0 refills | Status: DC

## 2017-02-17 NOTE — ED Triage Notes (Addendum)
Pt says his throat started to hurt yesterday. He has had some body aches. No other symptoms. Both kids and wife have strep right now.

## 2017-02-17 NOTE — ED Provider Notes (Signed)
[facility] ____________________________________________  Time seen: Approximately 9:45 AM  I have reviewed the triage vital signs and the nursing notes.   HISTORY  Chief Complaint Sore Throat   HPI Kurt Patel. is a 41 y.o. male presents for complaints of sore throat since yesterday. States gradual onset. Reports accompanying chills and body aches. Denies known fevers. States chronic dry cough from smoking, denies cough changes.  Reports wife and two children at home currently with strep throat. Denies other sick contacts. Denies congestion. No medications taken today. Reports continues to eat and drink well.   Denies chest pain, shortness of breath, abdominal pain, dysuria, extremity pain, extremity swelling or rash. Denies recent sickness. Denies recent antibiotic use.    Past Medical History:  Diagnosis Date  . Arthritis    hands, right hip  . GERD (gastroesophageal reflux disease)   . Legg-Calve-Perthes disease    right hip  . Skin cancer   . Vertigo    (x1) - 4 or 5 yrs ago  . Wears dentures    full upper    Patient Active Problem List   Diagnosis Date Noted  . Heartburn   . Adhesive capsulitis 11/21/2015  . Glenoid labral tear 09/06/2015  . Shoulder tendinitis 08/17/2015    Past Surgical History:  Procedure Laterality Date  . ESOPHAGOGASTRODUODENOSCOPY (EGD) WITH PROPOFOL N/A 06/05/2016   Procedure: ESOPHAGOGASTRODUODENOSCOPY (EGD) WITH PROPOFOL;  Surgeon: [name], MD;  Location: [place];  Service: Endoscopy;  Laterality: N/A;  . HIP SURGERY    . SHOULDER ARTHROSCOPY Right 09/05/2015   Procedure: ARTHROSCOPY SHOULDER DEBRIEDMENT and decompression;  Surgeon: Corky Mull, MD;  Location: [place];  Service: Orthopedics;  Laterality: Right;  . SKIN CANCER EXCISION     back  . THROAT SURGERY  12/14/2014   polyps removed- benign     No current facility-administered medications for this encounter.   Current  Outpatient Prescriptions:  .  DEXILANT 60 MG capsule, Take 1 capsule by mouth daily., Disp: , Rfl: 3 .  amoxicillin (AMOXIL) 875 MG tablet, Take 1 tablet (875 mg total) by mouth 2 (two) times daily., Disp: 20 tablet, Rfl: 0  Allergies Buspar [buspirone] and Prednisone  Family History  Problem Relation Age of Onset  . Diabetes Mother   . Hyperlipidemia Mother   . Hyperlipidemia Father   . Stroke Father     Social History Social History  Substance Use Topics  . Smoking status: Current Every Day Smoker    Packs/day: 1.00    Years: 20.00    Types: Cigarettes  . Smokeless tobacco: Never Used  . Alcohol use 7.2 oz/week    12 Cans of beer per week    Review of Systems Constitutional: No fever/chills Eyes: No visual changes. ENT: Positive sore throat. Cardiovascular: Denies chest pain. Respiratory: Denies shortness of breath. Gastrointestinal: No abdominal pain.  No nausea, no vomiting.  No diarrhea.  No constipation. Genitourinary: Negative for dysuria. Musculoskeletal: Negative for back pain. Skin: Negative for rash. Neurological: Negative for headaches, focal weakness or numbness.  10-point ROS otherwise negative.  ____________________________________________   PHYSICAL EXAM:  VITAL SIGNS: ED Triage Vitals  Enc Vitals Group     BP 02/17/17 0919 122/75     Pulse Rate 02/17/17 0919 98     Resp 02/17/17 0919 18     Temp 02/17/17 0919 98.2 F (36.8 C)     Temp Source 02/17/17 0919 Oral     SpO2 02/17/17 0919 100 %  Weight 02/17/17 0920 215 lb (97.5 kg)     Height 02/17/17 0920 5\' 10"  (1.778 m)     Head Circumference --      Peak Flow --      Pain Score 02/17/17 0922 2     Pain Loc --      Pain Edu? --      Excl. in [place]? --    Constitutional: Alert and oriented. Well appearing and in no acute distress. Eyes: Conjunctivae are normal. PERRL. EOMI. Head: Atraumatic. No sinus tenderness to palpation. No swelling. No erythema.  Ears: no erythema, normal TMs  bilaterally.   Nose: No nasal congestion or rhinorrhea.   Mouth/Throat: Mucous membranes are moist. Moderate pharyngeal erythema. No tonsillar swelling or exudate.  Neck: No stridor.  No cervical spine tenderness to palpation. Hematological/Lymphatic/Immunilogical: No cervical lymphadenopathy. Cardiovascular: Normal rate, regular rhythm. Grossly normal heart sounds.  Good peripheral circulation. Respiratory: Normal respiratory effort.  No retractions. No wheezes, rales or rhonchi. Good air movement.  Gastrointestinal: Soft and nontender.  Musculoskeletal: Ambulatory with steady gait. No cervical, thoracic or lumbar tenderness to palpation. Neurologic:  Normal speech and language. No gait instability. Skin:  Skin appears warm, dry and intact. No rash noted. Psychiatric: Mood and affect are normal. Speech and behavior are normal.  ___________________________________________   LABS (all labs ordered are listed, but only abnormal results are displayed)  Labs Reviewed  RAPID STREP SCREEN (NOT AT [facility])  CULTURE, GROUP A STREP [facility])     PROCEDURES Procedures    INITIAL IMPRESSION / ASSESSMENT AND PLAN / ED COURSE  Pertinent labs & imaging results that were available during my care of the patient were reviewed by me and considered in my medical decision making (see chart for details).  Well appearing patient, no acute distress. Quick strep negative, will culture. Suspect streptococcal pharyngitis, will treat with oral amoxicillin. Encourage rest, fluids and supportive care. Discussed indication, risks and benefits of medications with patient.  Discussed follow up with Primary care physician this week. Discussed follow up and return parameters including no resolution or any worsening concerns. Patient verbalized understanding and agreed to plan.    ____________________________________________   FINAL CLINICAL IMPRESSION(S) / ED DIAGNOSES  Final diagnoses:  Pharyngitis, unspecified  etiology     Discharge Medication List as of 02/17/2017  9:42 AM    START taking these medications   Details  amoxicillin (AMOXIL) 875 MG tablet Take 1 tablet (875 mg total) by mouth 2 (two) times daily., Starting Tue 02/17/2017, Normal        Note: This dictation was prepared with Dragon dictation along with smaller phrase technology. Any transcriptional errors that result from this process are unintentional.         Marylene Land, NP 02/17/17 1021

## 2017-02-17 NOTE — Discharge Instructions (Signed)
Take medication as prescribed. Rest. Drink plenty of fluids.  ° °Follow up with your primary care physician this week as needed. Return to Urgent care for new or worsening concerns.  ° °

## 2017-02-20 LAB — CULTURE, GROUP A STREP (THRC)

## 2017-07-16 ENCOUNTER — Encounter: Payer: Self-pay | Admitting: Family Medicine

## 2017-07-16 ENCOUNTER — Ambulatory Visit (INDEPENDENT_AMBULATORY_CARE_PROVIDER_SITE_OTHER): Payer: 59 | Admitting: Family Medicine

## 2017-07-16 ENCOUNTER — Ambulatory Visit
Admission: RE | Admit: 2017-07-16 | Discharge: 2017-07-16 | Disposition: A | Discharge status: Home / Self Care | Payer: 59 | Source: Ambulatory Visit | Attending: Family Medicine | Admitting: Family Medicine

## 2017-07-16 VITALS — BP 138/80 | HR 80 | Ht 70.0 in | Wt 212.0 lb

## 2017-07-16 DIAGNOSIS — F17210 Nicotine dependence, cigarettes, uncomplicated: Secondary | ICD-10-CM

## 2017-07-16 DIAGNOSIS — Z0000 Encounter for general adult medical examination without abnormal findings: Secondary | ICD-10-CM

## 2017-07-16 DIAGNOSIS — R079 Chest pain, unspecified: Secondary | ICD-10-CM

## 2017-07-16 DIAGNOSIS — E669 Obesity, unspecified: Secondary | ICD-10-CM | POA: Diagnosis not present

## 2017-07-16 DIAGNOSIS — R05 Cough: Secondary | ICD-10-CM | POA: Diagnosis not present

## 2017-07-16 DIAGNOSIS — R12 Heartburn: Secondary | ICD-10-CM | POA: Insufficient documentation

## 2017-07-16 DIAGNOSIS — E66811 Obesity, class 1: Secondary | ICD-10-CM

## 2017-07-16 DIAGNOSIS — R053 Chronic cough: Secondary | ICD-10-CM

## 2017-07-16 MED ORDER — PANTOPRAZOLE SODIUM 40 MG PO TBEC: 40.0000 mg | DELAYED_RELEASE_TABLET | Freq: Every day | ORAL | 11 refills | Status: DC

## 2017-07-16 NOTE — Progress Notes (Signed)
Name: Kurt Patel.   MRN: [id number]    DOB: [birth year]   Date:07/16/2017       Progress Note  Subjective  Chief Complaint  Chief Complaint  Patient presents with  . Annual Exam    wants a chest xray- has a cough and hx of smoking    Patient presents for annual physical exam.     Cough  This is a chronic problem. The current episode started more than 1 year ago. The problem has been unchanged. The problem occurs every few hours. The cough is non-productive. Associated symptoms include chest pain, shortness of breath and wheezing. Pertinent negatives include no chills, ear congestion, ear pain, fever, headaches, heartburn, hemoptysis, myalgias, nasal congestion, postnasal drip, rash, rhinorrhea, sore throat, sweats or weight loss. The symptoms are aggravated by lying down. He has tried nothing for the symptoms. There is no history of environmental allergies.  Chest Pain   This is a new problem. The current episode started more than 1 month ago (6-8 months). The onset quality is gradual. The problem occurs every several days. The problem has been unchanged. The pain is present in the substernal region. The pain is mild. The quality of the pain is described as pressure and tightness. The pain does not radiate. Associated symptoms include a cough and shortness of breath. Pertinent negatives include no abdominal pain, back pain, dizziness, fever, headaches, hemoptysis, malaise/fatigue, nausea, palpitations or sputum production. The cough is non-productive. Risk factors include smoking/tobacco exposure.    No problem-specific Assessment & Plan notes found for this encounter.   Past Medical History:  Diagnosis Date  . Arthritis    hands, right hip  . GERD (gastroesophageal reflux disease)   . Legg-Calve-Perthes disease    right hip  . Skin cancer   . Vertigo    (x1) - 4 or 5 yrs ago  . Wears dentures    full upper    Past Surgical History:  Procedure Laterality Date  .  ESOPHAGOGASTRODUODENOSCOPY (EGD) WITH PROPOFOL N/A 06/05/2016   Procedure: ESOPHAGOGASTRODUODENOSCOPY (EGD) WITH PROPOFOL;  Surgeon: [name], MD;  Location: [place];  Service: Endoscopy;  Laterality: N/A;  . HIP SURGERY    . SHOULDER ARTHROSCOPY Right 09/05/2015   Procedure: ARTHROSCOPY SHOULDER DEBRIEDMENT and decompression;  Surgeon: Corky Mull, MD;  Location: [place];  Service: Orthopedics;  Laterality: Right;  . SKIN CANCER EXCISION     back  . THROAT SURGERY  12/14/2014   polyps removed- benign    Family History  Problem Relation Age of Onset  . Diabetes Mother   . Hyperlipidemia Mother   . Hyperlipidemia Father   . Stroke Father     Social History   Social History  . Marital status: Married    Spouse name: N/A  . Number of children: N/A  . Years of education: N/A   Occupational History  . Not on file.   Social History Main Topics  . Smoking status: Current Every Day Smoker    Packs/day: 1.00    Years: 20.00    Types: Cigarettes  . Smokeless tobacco: Never Used  . Alcohol use 7.2 oz/week    12 Cans of beer per week  . Drug use: No  . Sexual activity: Yes   Other Topics Concern  . Not on file   Social History Narrative  . No narrative on file    Allergies  Allergen Reactions  . Buspar [Buspirone] Other (See Comments)    "  loopy"  . Prednisone Other (See Comments)    "makes me angry"    Outpatient Medications Prior to Visit  Medication Sig Dispense Refill  . DEXILANT 60 MG capsule Take 1 capsule by mouth daily.  3  . amoxicillin (AMOXIL) 875 MG tablet Take 1 tablet (875 mg total) by mouth 2 (two) times daily. 20 tablet 0   No facility-administered medications prior to visit.     Review of Systems  Constitutional: Negative for chills, fever, malaise/fatigue and weight loss.  HENT: Negative for ear discharge, ear pain, postnasal drip, rhinorrhea and sore throat.   Eyes: Negative for blurred vision.  Respiratory: Positive  for cough, shortness of breath and wheezing. Negative for hemoptysis and sputum production.   Cardiovascular: Positive for chest pain. Negative for palpitations and leg swelling.  Gastrointestinal: Negative for abdominal pain, blood in stool, constipation, diarrhea, heartburn, melena and nausea.  Genitourinary: Negative for dysuria, frequency, hematuria and urgency.  Musculoskeletal: Negative for back pain, joint pain, myalgias and neck pain.  Skin: Negative for rash.  Neurological: Negative for dizziness, tingling, sensory change, focal weakness and headaches.  Endo/Heme/Allergies: Negative for environmental allergies and polydipsia. Does not bruise/bleed easily.  Psychiatric/Behavioral: Negative for depression and suicidal ideas. The patient is not nervous/anxious and does not have insomnia.      Objective  Vitals:   07/16/17 0829  BP: 138/80  Pulse: 80  Weight: 212 lb (96.2 kg)  Height: 5\' 10"  (1.778 m)    Physical Exam  Constitutional: He is oriented to person, place, and time and well-developed, well-nourished, and in no distress. Vital signs are normal.  HENT:  Head: Normocephalic.  Right Ear: External ear and ear canal normal. A middle ear effusion is present.  Left Ear: Tympanic membrane, external ear and ear canal normal.  Nose: Nose normal. No mucosal edema or rhinorrhea.  Mouth/Throat: Uvula is midline and oropharynx is clear and moist. No oropharyngeal exudate, posterior oropharyngeal edema or posterior oropharyngeal erythema.  Eyes: Pupils are equal, round, and reactive to light. Conjunctivae, EOM and lids are normal. Right eye exhibits no discharge. Left eye exhibits no discharge. No scleral icterus.  Fundoscopic exam:      The right eye shows no arteriolar narrowing and no AV nicking.       The left eye shows no arteriolar narrowing and no AV nicking.  Neck: Normal range of motion. Neck supple. Normal carotid pulses, no hepatojugular reflux and no JVD present. Carotid  bruit is not present. No tracheal deviation present. No thyromegaly present.  Cardiovascular: Normal rate, regular rhythm, S1 normal, S2 normal, normal heart sounds, intact distal pulses and normal pulses.  PMI is not displaced.  Exam reveals no gallop, no S3, no S4 and no friction rub.   No murmur heard. Pulmonary/Chest: Breath sounds normal. No respiratory distress. He has no decreased breath sounds. He has no wheezes. He has no rhonchi. He has no rales. Right breast exhibits no mass. Left breast exhibits no mass.  Abdominal: Soft. Normal aorta and bowel sounds are normal. He exhibits no mass. There is no hepatosplenomegaly. There is no tenderness. There is no rebound, no guarding and no CVA tenderness.  Genitourinary: Rectum normal, prostate normal, testes/scrotum normal and penis normal. He exhibits no abnormal testicular mass, no testicular tenderness, no abnormal scrotal mass and no scrotal tenderness.  Musculoskeletal: Normal range of motion. He exhibits no edema or tenderness.       Cervical back: Normal.       Thoracic back:  Normal.       Lumbar back: Normal.  Lymphadenopathy:       Head (right side): No submandibular adenopathy present.       Head (left side): No submandibular adenopathy present.    He has no cervical adenopathy.    He has no axillary adenopathy.  Neurological: He is alert and oriented to person, place, and time. He has normal sensation, normal strength, normal reflexes and intact cranial nerves. No cranial nerve deficit.  Skin: Skin is warm, dry and intact. No rash noted.  Psychiatric: Mood and affect normal.  Nursing note and vitals reviewed.     Assessment & Plan  Problem List Items Addressed This Visit      Other   Heartburn   Relevant Orders   Renal Function Panel   DG Chest 2 View    Other Visit Diagnoses    Annual physical exam    -  Primary   Relevant Orders   Renal Function Panel   Lipid Profile   Chest pain, unspecified type       Relevant  Orders   EKG 12-Lead (Completed)   Renal Function Panel   Lipid Profile   DG Chest 2 View   Chronic cough       Relevant Orders   Renal Function Panel   DG Chest 2 View   Cigarette nicotine dependence without complication       Relevant Orders   Renal Function Panel   DG Chest 2 View   Obesity (BMI 30.0-34.9)       Relevant Orders   Lipid Profile      Meds ordered this encounter  Medications  . pantoprazole (PROTONIX) 40 MG tablet    Sig: Take 1 tablet (40 mg total) by mouth daily.    Dispense:  30 tablet    Refill:  11      Dr. Latresha [facility]  07/16/17

## 2017-07-17 LAB — RENAL FUNCTION PANEL
Albumin: 4.3 g/dL (ref 3.5–5.5)
BUN/Creatinine Ratio: 12 (ref 9–20)
BUN: 13 mg/dL (ref 6–20)
CO2: 26 mmol/L (ref 20–29)
CREATININE: 1.11 mg/dL (ref 0.76–1.27)
Calcium: 9.4 mg/dL (ref 8.7–10.2)
Chloride: 100 mmol/L (ref 96–106)
GFR calc Af Amer: 97 mL/min/{1.73_m2} (ref >59)
GFR, EST NON AFRICAN AMERICAN: 84 mL/min/{1.73_m2} (ref >59)
Glucose: 75 mg/dL (ref 65–99)
Phosphorus: 2.8 mg/dL (ref 2.5–4.5)
Potassium: 4.2 mmol/L (ref 3.5–5.2)
SODIUM: 141 mmol/L (ref 134–144)

## 2017-07-17 LAB — LIPID PANEL
CHOL/HDL RATIO: 3.9 ratio (ref 0.0–5.0)
Cholesterol, Total: 186 mg/dL (ref 100–199)
HDL: 48 mg/dL (ref >39)
LDL Calculated: 114 mg/dL — ABNORMAL HIGH (ref 0–99)
TRIGLYCERIDES: 122 mg/dL (ref 0–149)
VLDL Cholesterol Cal: 24 mg/dL (ref 5–40)

## 2017-07-22 ENCOUNTER — Other Ambulatory Visit: Payer: Self-pay

## 2017-11-02 ENCOUNTER — Other Ambulatory Visit: Payer: Self-pay

## 2017-11-02 ENCOUNTER — Ambulatory Visit (INDEPENDENT_AMBULATORY_CARE_PROVIDER_SITE_OTHER): Payer: 59

## 2017-11-02 ENCOUNTER — Ambulatory Visit
Admission: EM | Admit: 2017-11-02 | Discharge: 2017-11-02 | Disposition: A | Discharge status: Home / Self Care | Payer: 59

## 2017-11-02 ENCOUNTER — Encounter: Payer: Self-pay | Admitting: Emergency Medicine

## 2017-11-02 DIAGNOSIS — M545 Low back pain: Secondary | ICD-10-CM

## 2017-11-02 DIAGNOSIS — S39012A Strain of muscle, fascia and tendon of lower back, initial encounter: Secondary | ICD-10-CM | POA: Diagnosis not present

## 2017-11-02 MED ORDER — KETOROLAC TROMETHAMINE 60 MG/2ML IM SOLN
60.0000 mg | Freq: Once | INTRAMUSCULAR | Status: AC
  Administered 2017-11-02: 60 mg via INTRAMUSCULAR

## 2017-11-02 MED ORDER — METAXALONE 800 MG PO TABS: 800.0000 mg | ORAL_TABLET | Freq: Three times a day (TID) | ORAL | 0 refills | Status: DC

## 2017-11-02 MED ORDER — NAPROXEN 500 MG PO TABS: 500.0000 mg | ORAL_TABLET | Freq: Two times a day (BID) | ORAL | 0 refills | Status: DC

## 2017-11-02 NOTE — ED Provider Notes (Signed)
[facility]    CSN: [id number] Arrival date & time: 11/02/17  0804     History   Chief Complaint Chief Complaint  Patient presents with  . Back Pain    HPI Kurt Patel. is a 41 y.o. male.   HPI  41 year old male who presents with nonradiating low back pain.  States that yesterday he felt unsteady in his low back and this morning when he was bent over to brush his teeth he gagged and went down to his knees because of the back pain.  It is localized in the center of his back in the lower lumbar segments.  He has no radicular component.  He has no incontinence.  He had several episodes of back pain in the past which resolved on its own.  He states that over the weekend he had been splitting logs for firewood          Past Medical History:  Diagnosis Date  . Arthritis    hands, right hip  . GERD (gastroesophageal reflux disease)   . Legg-Calve-Perthes disease    right hip  . Skin cancer   . Vertigo    (x1) - 4 or 5 yrs ago  . Wears dentures    full upper    Patient Active Problem List   Diagnosis Date Noted  . Heartburn   . Adhesive capsulitis 11/21/2015  . Glenoid labral tear 09/06/2015  . Shoulder tendinitis 08/17/2015    Past Surgical History:  Procedure Laterality Date  . ESOPHAGOGASTRODUODENOSCOPY (EGD) WITH PROPOFOL N/A 06/05/2016   Procedure: ESOPHAGOGASTRODUODENOSCOPY (EGD) WITH PROPOFOL;  Surgeon: [name], MD;  Location: [place];  Service: Endoscopy;  Laterality: N/A;  . HIP SURGERY    . SHOULDER ARTHROSCOPY Right 09/05/2015   Procedure: ARTHROSCOPY SHOULDER DEBRIEDMENT and decompression;  Surgeon: Corky Mull, MD;  Location: [place];  Service: Orthopedics;  Laterality: Right;  . SKIN CANCER EXCISION     back  . THROAT SURGERY  12/14/2014   polyps removed- benign       Home Medications    Prior to Admission medications   Medication Sig Start Date End Date Taking? Authorizing Provider    dexlansoprazole (DEXILANT) 60 MG capsule Take 60 mg by mouth daily.   Yes [provider]  metaxalone (SKELAXIN) 800 MG tablet Take 1 tablet (800 mg total) by mouth 3 (three) times daily. 11/02/17   Lorin Picket, PA-C  naproxen (NAPROSYN) 500 MG tablet Take 1 tablet (500 mg total) by mouth 2 (two) times daily with a meal. 11/02/17   Lorin Picket, PA-C    Family History Family History  Problem Relation Age of Onset  . Diabetes Mother   . Hyperlipidemia Mother   . Hyperlipidemia Father   . Stroke Father     Social History Social History   Tobacco Use  . Smoking status: Current Some Day Smoker    Packs/day: 1.00    Years: 20.00    Pack years: 20.00    Types: Cigarettes  . Smokeless tobacco: Never Used  Substance Use Topics  . Alcohol use: Yes    Alcohol/week: 7.2 oz    Types: 12 Cans of beer per week  . Drug use: No     Allergies   Buspar [buspirone] and Prednisone   Review of Systems Review of Systems  Constitutional: Positive for activity change. Negative for chills, fatigue and fever.  Musculoskeletal: Positive for back pain and myalgias.  All other  systems reviewed and are negative.    Physical Exam Triage Vital Signs ED Triage Vitals  Enc Vitals Group     BP 11/02/17 0817 122/85     Pulse Rate 11/02/17 0817 79     Resp 11/02/17 0817 (!) 24     Temp 11/02/17 0817 97.9 F (36.6 C)     Temp Source 11/02/17 0817 Oral     SpO2 11/02/17 0817 100 %     Weight 11/02/17 0816 220 lb (99.8 kg)     Height 11/02/17 0816 5\' 9"  (1.753 m)     Head Circumference --      Peak Flow --      Pain Score 11/02/17 0817 4     Pain Loc --      Pain Edu? --      Excl. in [place]? --    No data found.  Updated Vital Signs BP 122/85 (BP Location: Right Arm)   Pulse 79   Temp 97.9 F (36.6 C) (Oral)   Resp (!) 24   Ht 5\' 9"  (1.753 m)   Wt 220 lb (99.8 kg)   SpO2 100%   BMI 32.49 kg/m   Visual Acuity Right Eye Distance:   Left Eye Distance:    Bilateral Distance:    Right Eye Near:   Left Eye Near:    Bilateral Near:     Physical Exam  Constitutional: He appears well-developed and well-nourished. No distress.  HENT:  Head: Normocephalic and atraumatic.  Eyes: Pupils are equal, round, and reactive to light.  Neck: Normal range of motion.  Musculoskeletal:  Examination of the lumbar spine shows the patient to stand with a decidedly forward flexion.  This is approximately 20 degrees.  He is unable to stand fully erect due to the pain.  Pain is over the midline lumbosacral junction.  Very little lateral flexion.  Is able to stand on his toes and on his heels.  Skin: He is not diaphoretic.  Nursing note and vitals reviewed.    UC Treatments / Results  Labs (all labs ordered are listed, but only abnormal results are displayed) Labs Reviewed - No data to display  EKG  EKG Interpretation None       Radiology Dg Lumbar Spine Complete  Result Date: 11/02/2017 CLINICAL DATA:  Acute on chronic back pain. EXAM: LUMBAR SPINE - COMPLETE 4+ VIEW COMPARISON:  CT abdomen pelvis dated February 16, 2011. FINDINGS: Five lumbar type vertebral bodies. No acute fracture or subluxation. Vertebral body heights are preserved. Alignment is normal. Intervertebral disc spaces are maintained. No pars defects. The sacroiliac joints are intact. IMPRESSION: No acute osseous abnormality or significant degenerative changes. Electronically Signed   By: Titus Dubin M.D.   On: 11/02/2017 09:30    Procedures Procedures (including critical care time)  Medications Ordered in UC Medications  ketorolac (TORADOL) injection 60 mg (60 mg Intramuscular Given 11/02/17 0904)     Initial Impression / Assessment and Plan / UC Course  I have reviewed the triage vital signs and the nursing notes.  Pertinent labs & imaging results that were available during my care of the patient were reviewed by me and considered in my medical decision making (see chart  for details).     Plan: 1. Test/x-ray results and diagnosis reviewed with patient 2. rx as per orders; risks, benefits, potential side effects reviewed with patient 3. Recommend supportive treatment with rest and symptom avoidance.  Recommend ice 20 minutes out of every 2  hours 4-5 times daily.  When he returns to work no lifting greater than 25 pounds for 7 days.  Advised him that working on his core strengthening would be very beneficial in helping prevent future insults.  He is not improving he should follow-up with Dr. Ronnald Ramp his primary care physician 4. F/u prn if symptoms worsen or don't improve   Final Clinical Impressions(s) / UC Diagnoses   Final diagnoses:  Strain of lumbar region, initial encounter    ED Discharge Orders        Ordered    naproxen (NAPROSYN) 500 MG tablet  2 times daily with meals     11/02/17 0942    metaxalone (SKELAXIN) 800 MG tablet  3 times daily     11/02/17 0942       Controlled Substance Prescriptions [place] Controlled Substance Registry consulted? Not Applicable   Lorin Picket, PA-C 11/02/17 8003

## 2017-11-02 NOTE — ED Triage Notes (Signed)
Patient in today with low back pain since early this morning. Patient unable to sit. Patient states he has a history of back problems.

## 2017-11-04 ENCOUNTER — Other Ambulatory Visit: Payer: Self-pay

## 2017-11-04 ENCOUNTER — Encounter: Payer: Self-pay | Admitting: Emergency Medicine

## 2017-11-04 ENCOUNTER — Ambulatory Visit
Admission: EM | Admit: 2017-11-04 | Discharge: 2017-11-04 | Disposition: A | Discharge status: Home / Self Care | Payer: 59 | Attending: Family Medicine | Admitting: Family Medicine

## 2017-11-04 DIAGNOSIS — M545 Low back pain, unspecified: Secondary | ICD-10-CM

## 2017-11-04 MED ORDER — CYCLOBENZAPRINE HCL 10 MG PO TABS: 10.0000 mg | ORAL_TABLET | Freq: Three times a day (TID) | ORAL | 0 refills | Status: DC | PRN

## 2017-11-04 MED ORDER — HYDROCODONE-ACETAMINOPHEN 5-325 MG PO TABS: ORAL_TABLET | ORAL | 0 refills | Status: DC

## 2017-11-04 NOTE — ED Provider Notes (Signed)
[facility]    CSN: [id number] Arrival date & time: 11/04/17  0846     History   Chief Complaint Chief Complaint  Patient presents with  . Back Pain    HPI Kurt Ambrocio. is a 41 y.o. male.   41 yo male with a c/o low back pain. Patient was seen here 2 days ago for the same and lumbar x-rays were negative. Patient states neither the toradol given that day or the prescription medications given helped. Denies pain radiating, fevers, chills, numbness/tingling, saddle anesthesia, bowel or bladder problems.    The history is provided by the patient.  Back Pain    Past Medical History:  Diagnosis Date  . Arthritis    hands, right hip  . GERD (gastroesophageal reflux disease)   . Legg-Calve-Perthes disease    right hip  . Skin cancer   . Vertigo    (x1) - 4 or 5 yrs ago  . Wears dentures    full upper    Patient Active Problem List   Diagnosis Date Noted  . Heartburn   . Adhesive capsulitis 11/21/2015  . Glenoid labral tear 09/06/2015  . Shoulder tendinitis 08/17/2015    Past Surgical History:  Procedure Laterality Date  . ESOPHAGOGASTRODUODENOSCOPY (EGD) WITH PROPOFOL N/A 06/05/2016   Procedure: ESOPHAGOGASTRODUODENOSCOPY (EGD) WITH PROPOFOL;  Surgeon: [name], MD;  Location: ;  Service: Endoscopy;  Laterality: N/A;  . HIP SURGERY    . SHOULDER ARTHROSCOPY Right 09/05/2015   Procedure: ARTHROSCOPY SHOULDER DEBRIEDMENT and decompression;  Surgeon: Corky Mull, MD;  Location: [place];  Service: Orthopedics;  Laterality: Right;  . SKIN CANCER EXCISION     back  . THROAT SURGERY  12/14/2014   polyps removed- benign       Home Medications    Prior to Admission medications   Medication Sig Start Date End Date Taking? Authorizing Provider  dexlansoprazole (DEXILANT) 60 MG capsule Take 60 mg by mouth daily.   Yes [provider]  metaxalone (SKELAXIN) 800 MG tablet Take 1 tablet (800 mg total) by mouth 3  (three) times daily. 11/02/17  Yes Lorin Picket, PA-C  naproxen (NAPROSYN) 500 MG tablet Take 1 tablet (500 mg total) by mouth 2 (two) times daily with a meal. 11/02/17  Yes Lorin Picket, PA-C  cyclobenzaprine (FLEXERIL) 10 MG tablet Take 1 tablet (10 mg total) by mouth 3 (three) times daily as needed for muscle spasms. 11/04/17   Norval Gable, MD  HYDROcodone-acetaminophen (NORCO/VICODIN) 5-325 MG tablet 1-2 tabs po q 8 hours prn 11/04/17   Norval Gable, MD    Family History Family History  Problem Relation Age of Onset  . Diabetes Mother   . Hyperlipidemia Mother   . Hyperlipidemia Father   . Stroke Father     Social History Social History   Tobacco Use  . Smoking status: Current Some Day Smoker    Packs/day: 1.00    Years: 20.00    Pack years: 20.00    Types: Cigarettes  . Smokeless tobacco: Never Used  Substance Use Topics  . Alcohol use: Yes    Alcohol/week: 7.2 oz    Types: 12 Cans of beer per week  . Drug use: No     Allergies   Buspar [buspirone] and Prednisone   Review of Systems Review of Systems  Musculoskeletal: Positive for back pain.     Physical Exam Triage Vital Signs ED Triage Vitals  Enc Vitals Group  BP 11/04/17 0916 119/88     Pulse Rate 11/04/17 0916 81     Resp 11/04/17 0916 20     Temp 11/04/17 0916 97.8 F (36.6 C)     Temp Source 11/04/17 0916 Oral     SpO2 11/04/17 0916 100 %     Weight 11/04/17 0916 225 lb (102.1 kg)     Height 11/04/17 0916 5\' 9"  (1.753 m)     Head Circumference --      Peak Flow --      Pain Score 11/04/17 0917 8     Pain Loc --      Pain Edu? --      Excl. in [place]? --    No data found.  Updated Vital Signs BP 119/88 (BP Location: Right Arm)   Pulse 81   Temp 97.8 F (36.6 C) (Oral)   Resp 20   Ht 5\' 9"  (1.753 m)   Wt 225 lb (102.1 kg)   SpO2 100%   BMI 33.23 kg/m   Visual Acuity Right Eye Distance:   Left Eye Distance:   Bilateral Distance:    Right Eye Near:   Left Eye  Near:    Bilateral Near:     Physical Exam  Constitutional: He appears well-developed and well-nourished. No distress.  Musculoskeletal:       Lumbar back: He exhibits tenderness (over the lumbar paraspinous muscles), pain and spasm. He exhibits normal range of motion, no bony tenderness, no swelling, no edema, no deformity, no laceration and normal pulse.  Skin: He is not diaphoretic.  Nursing note and vitals reviewed.    UC Treatments / Results  Labs (all labs ordered are listed, but only abnormal results are displayed) Labs Reviewed - No data to display  EKG  EKG Interpretation None       Radiology No results found.  Procedures Procedures (including critical care time)  Medications Ordered in UC Medications - No data to display   Initial Impression / Assessment and Plan / UC Course  I have reviewed the triage vital signs and the nursing notes.  Pertinent labs & imaging results that were available during my care of the patient were reviewed by me and considered in my medical decision making (see chart for details).       Final Clinical Impressions(s) / UC Diagnoses   Final diagnoses:  Acute low back pain without sciatica, unspecified back pain laterality    ED Discharge Orders        Ordered    cyclobenzaprine (FLEXERIL) 10 MG tablet  3 times daily PRN     11/04/17 0930    HYDROcodone-acetaminophen (NORCO/VICODIN) 5-325 MG tablet     11/04/17 0930     1. diagnosis reviewed with patient 2. rx as per orders above; reviewed possible side effects, interactions, risks and benefits  3. Recommend supportive treatment with heat, stretches 4. Follow-up prn if symptoms worsen or don't improve  Controlled Substance Prescriptions [place] Controlled Substance Registry consulted? Not Applicable   Norval Gable, MD 11/04/17 [phone]

## 2017-11-04 NOTE — ED Triage Notes (Signed)
Patient in today c/o severe low back pain. Patient was seen 11/02/17 for same.

## 2017-11-05 ENCOUNTER — Telehealth: Payer: Self-pay

## 2017-11-05 NOTE — Telephone Encounter (Signed)
Pt called with increasing back pain-  Has been to urgent care, [facility] and [facility]- not helping- sched appt with Reche Dixon @3 :00 today in [place]

## 2017-11-12 DIAGNOSIS — E669 Obesity, unspecified: Secondary | ICD-10-CM | POA: Insufficient documentation

## 2018-02-04 ENCOUNTER — Ambulatory Visit (INDEPENDENT_AMBULATORY_CARE_PROVIDER_SITE_OTHER): Payer: 59 | Admitting: Family Medicine

## 2018-02-04 ENCOUNTER — Encounter: Payer: Self-pay | Admitting: Family Medicine

## 2018-02-04 VITALS — BP 140/100 | HR 96 | Ht 69.0 in | Wt 227.0 lb

## 2018-02-04 DIAGNOSIS — G5601 Carpal tunnel syndrome, right upper limb: Secondary | ICD-10-CM | POA: Diagnosis not present

## 2018-02-04 DIAGNOSIS — J0100 Acute maxillary sinusitis, unspecified: Secondary | ICD-10-CM | POA: Diagnosis not present

## 2018-02-04 DIAGNOSIS — F17210 Nicotine dependence, cigarettes, uncomplicated: Secondary | ICD-10-CM

## 2018-02-04 DIAGNOSIS — D229 Melanocytic nevi, unspecified: Secondary | ICD-10-CM

## 2018-02-04 DIAGNOSIS — J40 Bronchitis, not specified as acute or chronic: Secondary | ICD-10-CM | POA: Diagnosis not present

## 2018-02-04 MED ORDER — MELOXICAM 15 MG PO TABS: 15.0000 mg | ORAL_TABLET | Freq: Every day | ORAL | 0 refills | Status: DC

## 2018-02-04 MED ORDER — AMOXICILLIN-POT CLAVULANATE 875-125 MG PO TABS: 1.0000 | ORAL_TABLET | Freq: Two times a day (BID) | ORAL | 0 refills | Status: DC

## 2018-02-04 MED ORDER — GUAIFENESIN-CODEINE 100-10 MG/5ML PO SYRP: 5.0000 mL | ORAL_SOLUTION | Freq: Three times a day (TID) | ORAL | 0 refills | Status: DC | PRN

## 2018-02-04 NOTE — Patient Instructions (Signed)
Coping with Quitting Smoking Quitting smoking is a physical and mental challenge. You will face cravings, withdrawal symptoms, and temptation. Before quitting, work with your health care provider to make a plan that can help you cope. Preparation can help you quit and keep you from giving in. How can I cope with cravings? Cravings usually last for 5-10 minutes. If you get through it, the craving will pass. Consider taking the following actions to help you cope with cravings:  Keep your mouth busy: ? Chew sugar-free gum. ? Suck on hard candies or a straw. ? Brush your teeth.  Keep your hands and body busy: ? Immediately change to a different activity when you feel a craving. ? Squeeze or play with a ball. ? Do an activity or a hobby, like making bead jewelry, practicing needlepoint, or working with wood. ? Mix up your normal routine. ? Take a short exercise break. Go for a quick walk or run up and down stairs. ? Spend time in public places where smoking is not allowed.  Focus on doing something kind or helpful for someone else.  Call a friend or family member to talk during a craving.  Join a support group.  Call a quit line, such as 1-800-QUIT-NOW.  Talk with your health care provider about medicines that might help you cope with cravings and make quitting easier for you.  How can I deal with withdrawal symptoms? Your body may experience negative effects as it tries to get used to not having nicotine in the system. These effects are called withdrawal symptoms. They may include:  Feeling hungrier than normal.  Trouble concentrating.  Irritability.  Trouble sleeping.  Feeling depressed.  Restlessness and agitation.  Craving a cigarette.  To manage withdrawal symptoms:  Avoid places, people, and activities that trigger your cravings.  Remember why you want to quit.  Get plenty of sleep.  Avoid coffee and other caffeinated drinks. These may worsen some of your  symptoms.  How can I handle social situations? Social situations can be difficult when you are quitting smoking, especially in the first few weeks. To manage this, you can:  Avoid parties, bars, and other social situations where people might be smoking.  Avoid alcohol.  Leave right away if you have the urge to smoke.  Explain to your family and friends that you are quitting smoking. Ask for understanding and support.  Plan activities with friends or family where smoking is not an option.  What are some ways I can cope with stress? Wanting to smoke may cause stress, and stress can make you want to smoke. Find ways to manage your stress. Relaxation techniques can help. For example:  Breathe slowly and deeply, in through your nose and out through your mouth.  Listen to soothing, relaxing music.  Talk with a family member or friend about your stress.  Light a candle.  Soak in a bath or take a shower.  Think about a peaceful place.  What are some ways I can prevent weight gain? Be aware that many people gain weight after they quit smoking. However, not everyone does. To keep from gaining weight, have a plan in place before you quit and stick to the plan after you quit. Your plan should include:  Having healthy snacks. When you have a craving, it may help to: ? Eat plain popcorn, crunchy carrots, celery, or other cut vegetables. ? Chew sugar-free gum.  Changing how you eat: ? Eat small portion sizes at meals. ?   Eat 4-6 small meals throughout the day instead of 1-2 large meals a day. ? Be mindful when you eat. Do not watch television or do other things that might distract you as you eat.  Exercising regularly: ? Make time to exercise each day. If you do not have time for a long workout, do short bouts of exercise for 5-10 minutes several times a day. ? Do some form of strengthening exercise, like weight lifting, and some form of aerobic exercise, like running or  swimming.  Drinking plenty of water or other low-calorie or no-calorie drinks. Drink 6-8 glasses of water daily, or as much as instructed by your health care provider.  Summary  Quitting smoking is a physical and mental challenge. You will face cravings, withdrawal symptoms, and temptation to smoke again. Preparation can help you as you go through these challenges.  You can cope with cravings by keeping your mouth busy (such as by chewing gum), keeping your body and hands busy, and making calls to family, friends, or a helpline for people who want to quit smoking.  You can cope with withdrawal symptoms by avoiding places where people smoke, avoiding drinks with caffeine, and getting plenty of rest.  Ask your health care provider about the different ways to prevent weight gain, avoid stress, and handle social situations. This information is not intended to replace advice given to you by your health care provider. Make sure you discuss any questions you have with your health care provider. Document Released: 11/21/2016 Document Revised: 11/21/2016 Document Reviewed: 11/21/2016 [employer] Interactive Patient Education  2018 [employer]  

## 2018-02-04 NOTE — Progress Notes (Signed)
Name: Kurt Patel.   MRN: [id number]    DOB: Nov 28, 1976   Date:02/04/2018       Progress Note  Subjective  Chief Complaint  Chief Complaint  Patient presents with  . Sinusitis    cough and cong- headache, bloody production from nose  . Wrist Pain    R) wrist is worse than the L)- had a round of Etodolac in the past- did not help  . Nevus    on lower back- raised and has gotten larger    Patient has concern for a mole. Chnges/multicollored on back   Sinusitis  This is a new problem. The current episode started in the past 7 days (Monday). The problem has been gradually worsening since onset. There has been no fever. The fever has been present for 1 to 2 days. The pain is moderate. Associated symptoms include chills, congestion and sinus pressure. Pertinent negatives include no coughing, diaphoresis, ear pain, headaches (bloody Haig Prophet), hoarse voice, neck pain, shortness of breath, sneezing, sore throat or swollen glands. Past treatments include nothing. The treatment provided moderate relief.  Wrist Pain   The pain is present in the right wrist. This is a chronic problem. The current episode started more than 1 year ago. There has been no history of extremity trauma. The problem occurs daily. The problem has been rapidly worsening. The quality of the pain is described as aching. The pain is moderate. Associated symptoms include numbness. Pertinent negatives include no fever or tingling. Associated symptoms comments: Median nerve numbness. The symptoms are aggravated by activity. He has tried NSAIDS for the symptoms. The treatment provided no relief.    No problem-specific Assessment & Plan notes found for this encounter.   Past Medical History:  Diagnosis Date  . Arthritis    hands, right hip  . GERD (gastroesophageal reflux disease)   . Legg-Calve-Perthes disease    right hip  . Skin cancer   . Vertigo    (x1) - 4 or 5 yrs ago  . Wears dentures    full upper    Past  Surgical History:  Procedure Laterality Date  . ESOPHAGOGASTRODUODENOSCOPY (EGD) WITH PROPOFOL N/A 06/05/2016   Procedure: ESOPHAGOGASTRODUODENOSCOPY (EGD) WITH PROPOFOL;  Surgeon: [name], MD;  Location: [facility];  Service: Endoscopy;  Laterality: N/A;  . HIP SURGERY    . SHOULDER ARTHROSCOPY Right 09/05/2015   Procedure: ARTHROSCOPY SHOULDER DEBRIEDMENT and decompression;  Surgeon: Corky Mull, MD;  Location: [facility];  Service: Orthopedics;  Laterality: Right;  . SKIN CANCER EXCISION     back  . THROAT SURGERY  12/14/2014   polyps removed- benign    Family History  Problem Relation Age of Onset  . Diabetes Mother   . Hyperlipidemia Mother   . Hyperlipidemia Father   . Stroke Father     Social History   Socioeconomic History  . Marital status: Married    Spouse name: Not on file  . Number of children: Not on file  . Years of education: Not on file  . Highest education level: Not on file  Social Needs  . Financial resource strain: Not on file  . Food insecurity - worry: Not on file  . Food insecurity - inability: Not on file  . Transportation needs - medical: Not on file  . Transportation needs - non-medical: Not on file  Occupational History  . Not on file  Tobacco Use  . Smoking status: Current Some Day Smoker  Packs/day: 1.00    Years: 20.00    Pack years: 20.00    Types: Cigarettes  . Smokeless tobacco: Never Used  Substance and Sexual Activity  . Alcohol use: Yes    Alcohol/week: 7.2 oz    Types: 12 Cans of beer per week  . Drug use: No  . Sexual activity: Yes  Other Topics Concern  . Not on file  Social History Narrative  . Not on file    Allergies  Allergen Reactions  . Buspar [Buspirone] Other (See Comments)    "loopy"  . Prednisone Other (See Comments)    "makes me angry"    Outpatient Medications Prior to Visit  Medication Sig Dispense Refill  . dexlansoprazole (DEXILANT) 60 MG capsule Take 60 mg by mouth daily.     . Glucosamine-MSM-Hyaluronic Acd (JOINT HEALTH PO) Take 3 tablets by mouth daily.    . cyclobenzaprine (FLEXERIL) 10 MG tablet Take 1 tablet (10 mg total) by mouth 3 (three) times daily as needed for muscle spasms. 30 tablet 0  . HYDROcodone-acetaminophen (NORCO/VICODIN) 5-325 MG tablet 1-2 tabs po q 8 hours prn 6 tablet 0  . metaxalone (SKELAXIN) 800 MG tablet Take 1 tablet (800 mg total) by mouth 3 (three) times daily. 21 tablet 0  . naproxen (NAPROSYN) 500 MG tablet Take 1 tablet (500 mg total) by mouth 2 (two) times daily with a meal. 60 tablet 0   No facility-administered medications prior to visit.     Review of Systems  Constitutional: Positive for chills. Negative for diaphoresis, fever, malaise/fatigue and weight loss.  HENT: Positive for congestion and sinus pressure. Negative for ear discharge, ear pain, hoarse voice, sneezing and sore throat.   Eyes: Negative for blurred vision.  Respiratory: Negative for cough, sputum production, shortness of breath and wheezing.   Cardiovascular: Negative for chest pain, palpitations and leg swelling.  Gastrointestinal: Negative for abdominal pain, blood in stool, constipation, diarrhea, heartburn, melena and nausea.  Genitourinary: Negative for dysuria, frequency, hematuria and urgency.  Musculoskeletal: Negative for back pain, joint pain, myalgias and neck pain.  Skin: Negative for rash.  Neurological: Positive for numbness. Negative for dizziness, tingling, sensory change, focal weakness and headaches (bloody Haig Prophet).  Endo/Heme/Allergies: Negative for environmental allergies and polydipsia. Does not bruise/bleed easily.  Psychiatric/Behavioral: Negative for depression and suicidal ideas. The patient is not nervous/anxious and does not have insomnia.      Objective  Vitals:   02/04/18 1408  BP: (!) 140/100  Pulse: 96  Weight: 227 lb (103 kg)  Height: 5\' 9"  (1.753 m)    Physical Exam  Constitutional: He is oriented to person,  place, and time and well-developed, well-nourished, and in no distress.  HENT:  Head: Normocephalic.  Right Ear: External ear normal.  Left Ear: Tympanic membrane, external ear and ear canal normal.  Nose: Mucosal edema present. Right sinus exhibits maxillary sinus tenderness. Left sinus exhibits maxillary sinus tenderness.  Mouth/Throat: Oropharynx is clear and moist. No oropharyngeal exudate, posterior oropharyngeal edema or posterior oropharyngeal erythema.  Eyes: Conjunctivae and EOM are normal. Pupils are equal, round, and reactive to light. Right eye exhibits no discharge. Left eye exhibits no discharge. No scleral icterus.  Neck: Normal range of motion. Neck supple. No JVD present. No tracheal deviation present. No thyromegaly present.  Cardiovascular: Normal rate, regular rhythm, normal heart sounds and intact distal pulses. Exam reveals no gallop and no friction rub.  No murmur heard. Pulmonary/Chest: Breath sounds normal. No respiratory distress. He has no  wheezes. He has no rales. He exhibits no tenderness.  Abdominal: Soft. Bowel sounds are normal. He exhibits no mass. There is no hepatosplenomegaly. There is no tenderness. There is no rebound, no guarding and no CVA tenderness.  Musculoskeletal: Normal range of motion. He exhibits no edema.       Right wrist: He exhibits tenderness.  Positive Tinel/Phelen  Lymphadenopathy:    He has no cervical adenopathy.  Neurological: He is alert and oriented to person, place, and time. He has normal sensation, normal strength, normal reflexes and intact cranial nerves. No cranial nerve deficit.  Skin: Skin is warm. Rash noted. Rash is macular.  Psychiatric: Mood and affect normal.  Nursing note and vitals reviewed.     Assessment & Plan  Problem List Items Addressed This Visit    None    Visit Diagnoses    Acute maxillary sinusitis, recurrence not specified    -  Primary   Relevant Medications   amoxicillin-clavulanate (AUGMENTIN)  875-125 MG tablet   guaiFENesin-codeine (ROBITUSSIN AC) 100-10 MG/5ML syrup   Carpal tunnel syndrome of right wrist       new   Relevant Medications   meloxicam (MOBIC) 15 MG tablet   Other Relevant Orders   Ambulatory referral to Orthopedic Surgery   Nevus       mole check   Relevant Orders   Ambulatory referral to Dermatology   Bronchitis       Relevant Medications   guaiFENesin-codeine (ROBITUSSIN AC) 100-10 MG/5ML syrup   Cigarette nicotine dependence without complication          Meds ordered this encounter  Medications  . meloxicam (MOBIC) 15 MG tablet    Sig: Take 1 tablet (15 mg total) by mouth daily.    Dispense:  30 tablet    Refill:  0  . amoxicillin-clavulanate (AUGMENTIN) 875-125 MG tablet    Sig: Take 1 tablet by mouth 2 (two) times daily.    Dispense:  20 tablet    Refill:  0  . guaiFENesin-codeine (ROBITUSSIN AC) 100-10 MG/5ML syrup    Sig: Take 5 mLs by mouth 3 (three) times daily as needed for cough.    Dispense:  150 mL    Refill:  0  Patient has been advised of the health risks of smoking and counseled concerning cessation of tobacco products. I spent over 3 minutes for discussion and to answer questions.    Dr. [facility]  02/04/18

## 2018-02-10 ENCOUNTER — Other Ambulatory Visit: Payer: Self-pay

## 2018-03-16 DIAGNOSIS — M5030 Other cervical disc degeneration, unspecified cervical region: Secondary | ICD-10-CM | POA: Insufficient documentation

## 2018-03-17 ENCOUNTER — Other Ambulatory Visit: Payer: Self-pay | Admitting: Orthopedic Surgery

## 2018-03-17 DIAGNOSIS — M5412 Radiculopathy, cervical region: Secondary | ICD-10-CM

## 2018-03-17 DIAGNOSIS — M5030 Other cervical disc degeneration, unspecified cervical region: Secondary | ICD-10-CM

## 2018-03-25 ENCOUNTER — Ambulatory Visit
Admission: RE | Admit: 2018-03-25 | Discharge: 2018-03-25 | Disposition: A | Discharge status: Home / Self Care | Payer: 59 | Source: Ambulatory Visit | Attending: Orthopedic Surgery | Admitting: Orthopedic Surgery

## 2018-03-25 DIAGNOSIS — M4802 Spinal stenosis, cervical region: Secondary | ICD-10-CM | POA: Diagnosis not present

## 2018-03-25 DIAGNOSIS — M50122 Cervical disc disorder at C5-C6 level with radiculopathy: Secondary | ICD-10-CM | POA: Diagnosis not present

## 2018-03-25 DIAGNOSIS — M5412 Radiculopathy, cervical region: Secondary | ICD-10-CM

## 2018-03-25 DIAGNOSIS — M542 Cervicalgia: Secondary | ICD-10-CM | POA: Diagnosis present

## 2018-03-25 DIAGNOSIS — M5030 Other cervical disc degeneration, unspecified cervical region: Secondary | ICD-10-CM

## 2019-10-10 DIAGNOSIS — T22292A Burn of second degree of multiple sites of left shoulder and upper limb, except wrist and hand, initial encounter: Secondary | ICD-10-CM | POA: Insufficient documentation

## 2019-10-11 MED ORDER — ONDANSETRON HCL 8 MG PO TABS
4.00 | ORAL_TABLET | ORAL | Status: DC
Start: ? — End: 2019-10-11

## 2019-10-11 MED ORDER — NICOTINE POLACRILEX 2 MG MT GUM
2.00 | CHEWING_GUM | OROMUCOSAL | Status: DC
Start: ? — End: 2019-10-11

## 2019-10-11 MED ORDER — Medication
500.00 | Status: DC
Start: 2019-10-12 — End: 2019-10-11

## 2019-10-11 MED ORDER — FENTANYL CITRATE (PF) 50 MCG/ML IJ SOLN
25.00 | INTRAMUSCULAR | Status: DC
Start: ? — End: 2019-10-11

## 2019-10-11 MED ORDER — Medication
1.00 | Status: DC
Start: 2019-10-12 — End: 2019-10-11

## 2019-10-11 MED ORDER — GENTAK 0.3 % OP SOLN
1.00 | OPHTHALMIC | Status: DC
Start: 2019-10-12 — End: 2019-10-11

## 2019-10-11 MED ORDER — ENOXAPARIN SODIUM 30 MG/0.3ML ~~LOC~~ SOLN
30.00 | SUBCUTANEOUS | Status: DC
Start: 2019-10-11 — End: 2019-10-11

## 2019-10-11 MED ORDER — POLYETHYLENE GLYCOL 3350 17 G PO PACK
17.00 | PACK | ORAL | Status: DC
Start: ? — End: 2019-10-11

## 2019-10-11 MED ORDER — HYDROXYZINE HCL 25 MG PO TABS
25.00 | ORAL_TABLET | ORAL | Status: DC
Start: ? — End: 2019-10-11

## 2019-10-11 MED ORDER — ACETAMINOPHEN 500 MG PO TABS
1000.00 | ORAL_TABLET | ORAL | Status: DC
Start: 2019-10-11 — End: 2019-10-11

## 2019-10-11 MED ORDER — DOCUSATE SODIUM 100 MG PO CAPS
100.00 | ORAL_CAPSULE | ORAL | Status: DC
Start: 2019-10-11 — End: 2019-10-11

## 2019-10-11 MED ORDER — DIPHENHYDRAMINE HCL 25 MG PO CAPS
25.00 | ORAL_CAPSULE | ORAL | Status: DC
Start: ? — End: 2019-10-11

## 2019-10-11 MED ORDER — BAYER WOMENS 81-300 MG PO TABS
40.00 | ORAL_TABLET | ORAL | Status: DC
Start: 2019-10-12 — End: 2019-10-11

## 2019-10-11 MED ORDER — GABAPENTIN 300 MG PO CAPS
300.00 | ORAL_CAPSULE | ORAL | Status: DC
Start: 2019-10-11 — End: 2019-10-11

## 2019-10-11 MED ORDER — GENERIC EXTERNAL MEDICATION
12.50 | Status: DC
Start: ? — End: 2019-10-11

## 2019-10-11 MED ORDER — GENERIC EXTERNAL MEDICATION
Status: DC
Start: ? — End: 2019-10-11

## 2019-10-11 MED ORDER — MELATONIN 3 MG PO TABS
3.00 | ORAL_TABLET | ORAL | Status: DC
Start: ? — End: 2019-10-11

## 2019-11-01 DIAGNOSIS — R490 Dysphonia: Secondary | ICD-10-CM | POA: Insufficient documentation

## 2020-01-04 DIAGNOSIS — J040 Acute laryngitis: Secondary | ICD-10-CM | POA: Insufficient documentation

## 2020-04-05 ENCOUNTER — Other Ambulatory Visit: Payer: Self-pay

## 2020-04-05 ENCOUNTER — Encounter: Payer: Self-pay | Admitting: Emergency Medicine

## 2020-04-05 ENCOUNTER — Emergency Department: Payer: BC Managed Care – PPO

## 2020-04-05 ENCOUNTER — Emergency Department
Admission: EM | Admit: 2020-04-05 | Discharge: 2020-04-05 | Disposition: A | Discharge status: Home / Self Care | Payer: BC Managed Care – PPO | Attending: Student | Admitting: Student

## 2020-04-05 DIAGNOSIS — Z79899 Other long term (current) drug therapy: Secondary | ICD-10-CM | POA: Insufficient documentation

## 2020-04-05 DIAGNOSIS — F17210 Nicotine dependence, cigarettes, uncomplicated: Secondary | ICD-10-CM | POA: Insufficient documentation

## 2020-04-05 DIAGNOSIS — R141 Gas pain: Secondary | ICD-10-CM | POA: Diagnosis not present

## 2020-04-05 DIAGNOSIS — Z85828 Personal history of other malignant neoplasm of skin: Secondary | ICD-10-CM | POA: Diagnosis not present

## 2020-04-05 DIAGNOSIS — K5730 Diverticulosis of large intestine without perforation or abscess without bleeding: Secondary | ICD-10-CM | POA: Diagnosis not present

## 2020-04-05 DIAGNOSIS — K921 Melena: Secondary | ICD-10-CM | POA: Diagnosis not present

## 2020-04-05 LAB — COMPREHENSIVE METABOLIC PANEL
ALT: 41 U/L (ref 0–44)
AST: 25 U/L (ref 15–41)
Albumin: 4.3 g/dL (ref 3.5–5.0)
Alkaline Phosphatase: 76 U/L (ref 38–126)
Anion gap: 5 (ref 5–15)
BUN: 17 mg/dL (ref 6–20)
CO2: 29 mmol/L (ref 22–32)
Calcium: 9.2 mg/dL (ref 8.9–10.3)
Chloride: 107 mmol/L (ref 98–111)
Creatinine, Ser: 1.24 mg/dL (ref 0.61–1.24)
GFR calc Af Amer: >60 mL/min (ref >60)
GFR calc non Af Amer: >60 mL/min (ref >60)
Glucose, Bld: 121 mg/dL — ABNORMAL HIGH (ref 70–99)
Potassium: 4.6 mmol/L (ref 3.5–5.1)
Sodium: 141 mmol/L (ref 135–145)
Total Bilirubin: 0.8 mg/dL (ref 0.3–1.2)
Total Protein: 7.7 g/dL (ref 6.5–8.1)

## 2020-04-05 LAB — CBC
HCT: 48.1 % (ref 39.0–52.0)
Hemoglobin: 16.8 g/dL (ref 13.0–17.0)
MCH: 31.5 pg (ref 26.0–34.0)
MCHC: 34.9 g/dL (ref 30.0–36.0)
MCV: 90.1 fL (ref 80.0–100.0)
Platelets: 276 10*3/uL (ref 150–400)
RBC: 5.34 MIL/uL (ref 4.22–5.81)
RDW: 12.9 % (ref 11.5–15.5)
WBC: 12.9 10*3/uL — ABNORMAL HIGH (ref 4.0–10.5)
nRBC: 0 % (ref 0.0–0.2)

## 2020-04-05 LAB — TYPE AND SCREEN
ABO/RH(D): A POS
Antibody Screen: NEGATIVE

## 2020-04-05 MED ORDER — IOHEXOL 300 MG/ML  SOLN
100.0000 mL | Freq: Once | INTRAMUSCULAR | Status: AC | PRN
  Administered 2020-04-05: 100 mL via INTRAVENOUS

## 2020-04-05 MED ORDER — PANTOPRAZOLE SODIUM 40 MG IV SOLR
40.0000 mg | Freq: Once | INTRAVENOUS | Status: AC
  Administered 2020-04-05: 16:00:00 40 mg via INTRAVENOUS
  Filled 2020-04-05: qty 40

## 2020-04-05 MED ORDER — SODIUM CHLORIDE 0.9 % IV BOLUS
1000.0000 mL | Freq: Once | INTRAVENOUS | Status: AC
  Administered 2020-04-05: 1000 mL via INTRAVENOUS

## 2020-04-05 NOTE — ED Notes (Signed)
See triage note. Pt reports "off discomfort in stomach"; denies changes in what he has been eating/drinking; denies black tarry stools; denies major history; history of hemorrhoids; reports feeling very fatigued.

## 2020-04-05 NOTE — ED Provider Notes (Signed)
[facility] Emergency Department Provider Note  ____________________________________________   First MD Initiated Contact with Patient 04/05/20 1422     (approximate)  I have reviewed the triage vital signs and the nursing notes.  History  Chief Complaint Blood In Stools    HPI Kurt Patel. is a 44 y.o. male with a history of GERD who presents to the emergency department for bloody stools.  Patient states over the last few days he has had slightly looser stools than normal.  Then this morning, he experienced some "gas discomfort" in his stomach and had to use the bathroom several times today.  Reports having 4 bowel movements this morning and noticing bright red blood in his stool.  Reports a history of hemorrhoids with mild bleeding when wiping, but never like this.  Not feel he has a hemorrhoid at present.  Not on any blood thinning medication.  Reports he is compliant with his GERD medication.  Denies any black stools or melena.  No nausea, vomiting, lightheadedness, or syncope.  Does report drinking several beers a day.  Denies any heavy NSAID use.   Past Medical Hx Past Medical History:  Diagnosis Date  . Arthritis    hands, right hip  . GERD (gastroesophageal reflux disease)   . Legg-Calve-Perthes disease    right hip  . Skin cancer   . Vertigo    (x1) - 4 or 5 yrs ago  . Wears dentures    full upper    Problem List Patient Active Problem List   Diagnosis Date Noted  . Heartburn   . Adhesive capsulitis 11/21/2015  . Glenoid labral tear 09/06/2015  . Shoulder tendinitis 08/17/2015    Past Surgical Hx Past Surgical History:  Procedure Laterality Date  . ESOPHAGOGASTRODUODENOSCOPY (EGD) WITH PROPOFOL N/A 06/05/2016   Procedure: ESOPHAGOGASTRODUODENOSCOPY (EGD) WITH PROPOFOL;  Surgeon: [name], MD;  Location: [facility];  Service: Endoscopy;  Laterality: N/A;  . HIP SURGERY    . SHOULDER ARTHROSCOPY Right 09/05/2015   Procedure: ARTHROSCOPY SHOULDER DEBRIEDMENT and decompression;  Surgeon: Corky Mull, MD;  Location: [facility];  Service: Orthopedics;  Laterality: Right;  . SKIN CANCER EXCISION     back  . THROAT SURGERY  12/14/2014   polyps removed- benign    Medications Prior to Admission medications   Medication Sig Start Date End Date Taking? Authorizing Provider  amoxicillin-clavulanate (AUGMENTIN) 875-125 MG tablet Take 1 tablet by mouth 2 (two) times daily. 02/04/18   Juline Patch, MD  dexlansoprazole (DEXILANT) 60 MG capsule Take 60 mg by mouth daily.    [provider]  Glucosamine-MSM-Hyaluronic Acd (JOINT HEALTH PO) Take 3 tablets by mouth daily.    [provider]  guaiFENesin-codeine (ROBITUSSIN AC) 100-10 MG/5ML syrup Take 5 mLs by mouth 3 (three) times daily as needed for cough. 02/04/18   Juline Patch, MD  meloxicam (MOBIC) 15 MG tablet Take 1 tablet (15 mg total) by mouth daily. 02/04/18   Juline Patch, MD    Allergies Buspar [buspirone] and Prednisone  Family Hx Family History  Problem Relation Age of Onset  . Diabetes Mother   . Hyperlipidemia Mother   . Hyperlipidemia Father   . Stroke Father     Social Hx Social History   Tobacco Use  . Smoking status: Current Some Day Smoker    Packs/day: 1.00    Years: 20.00    Pack years: 20.00    Types: Cigarettes  .  Smokeless tobacco: Never Used  Substance Use Topics  . Alcohol use: Yes    Alcohol/week: 12.0 standard drinks    Types: 12 Cans of beer per week  . Drug use: No     Review of Systems  Constitutional: Negative for fever. Negative for chills. Eyes: Negative for visual changes. ENT: Negative for sore throat. Cardiovascular: Negative for chest pain. Respiratory: Negative for shortness of breath. Gastrointestinal: Negative for nausea. Negative for vomiting.  Positive for bloody stools. Genitourinary: Negative for dysuria. Musculoskeletal: Negative for leg swelling. Skin:  Negative for rash. Neurological: Negative for headaches.   Physical Exam  Vital Signs: ED Triage Vitals  Enc Vitals Group     BP 04/05/20 1237 120/78     Pulse Rate 04/05/20 1237 94     Resp 04/05/20 1237 17     Temp 04/05/20 1237 98.2 F (36.8 C)     Temp Source 04/05/20 1237 Oral     SpO2 04/05/20 1237 98 %     Weight 04/05/20 1239 220 lb (99.8 kg)     Height 04/05/20 1239 5\' 10"  (1.778 m)     Head Circumference --      Peak Flow --      Pain Score 04/05/20 1239 0     Pain Loc --      Pain Edu? --      Excl. in [place]? --     Constitutional: Alert and oriented. Well appearing. NAD.  Head: Normocephalic. Atraumatic. Eyes: Conjunctivae clear. Sclera anicteric. Pupils equal and symmetric. Nose: No masses or lesions. No congestion or rhinorrhea. Mouth/Throat: Wearing mask.  Neck: No stridor. Trachea midline.  Cardiovascular: Normal rate, regular rhythm. Extremities well perfused. Respiratory: Normal respiratory effort.  Lungs CTAB. Gastrointestinal: Soft. Non-distended. Non-tender throughout to deep palpation.  Genitourinary: RN chaperone present.  Brown stool, guaiac positive.  No anal fissures.  No external hemorrhoids. Musculoskeletal: No lower extremity edema. No deformities. Neurologic:  Normal speech and language. No gross focal or lateralizing neurologic deficits are appreciated.  Skin: Skin is warm, dry and intact. No rash noted. Psychiatric: Mood and affect are appropriate for situation.   Radiology  Personally reviewed available imaging myself.   CT A/P -  IMPRESSION:  1. No CT evidence for acute intra-abdominal or pelvic abnormality.  2. Diffuse diverticular disease of the colon without acute  inflammatory change.    Procedures  Procedure(s) performed (including critical care):  Procedures   Initial Impression / Assessment and Plan / MDM / ED Course  44 y.o. male who presents to the ED for bloody stools, as above.  Ddx: colitis, diverticular bleed,  diverticulitis.  Less likely upper GI bleed given blood is bright red, but consider possible gastritis component given he is a daily drinker.  Evidence of hemorrhoid or fissure on exam.  Will plan for labs, imaging  Labs reveal normal hemoglobin, hematocrit.  Mild and nonspecifically elevated WBC at 12.9.  Remainder of labs without actionable derangements.  CT without any acute intra-abdominal abnormalities, diffuse diverticular disease but no acute inflammatory changes.  Therefore less likely to be acute diverticulitis or colitis.  However, consider possible diverticular bleed given the presence on CT scan.  As his H&H is stable and he is otherwise hemodynamically stable and tolerated by mouth in the ED, feel he is stable for discharge with outpatient GI follow-up.  Advised adherence with his PPI and reduction of his alcohol intake as possible.  He voices understanding and is comfortable to plan and discharge.  _______________________________   As part of my medical decision making I have reviewed available labs, radiology tests, reviewed old records/performed chart review.    Final Clinical Impression(s) / ED Diagnosis  Final diagnoses:  Bloody stools  Diverticula of colon       Note:  This document was prepared using Dragon voice recognition software and may include unintentional dictation errors.   Lilia Pro., MD 04/05/20 [phone]

## 2020-04-05 NOTE — Discharge Instructions (Addendum)
Thank you for letting us take care of you in the emergency department today.  ° °Please continue to take any regular, prescribed medications.  ° °Please follow up with: °- Your primary care doctor to review your ER visit and follow up on your symptoms.  ° °Please return to the ER for any new or worsening symptoms.  ° °

## 2020-04-05 NOTE — ED Triage Notes (Addendum)
Pt st generalized abd  Pain "felt like I had gas", had 4x BM this morning and noticed bright red blood in his stool. St feeling "weak" since this morning. Dines dizziness/SHOB. Called his PCP and sent to ED for evaluation. Denies taking blood thinners. NAD noted at this time.

## 2020-04-05 NOTE — ED Notes (Signed)
Pt given snack and drink per order.

## 2020-04-10 ENCOUNTER — Ambulatory Visit (INDEPENDENT_AMBULATORY_CARE_PROVIDER_SITE_OTHER): Payer: BC Managed Care – PPO | Admitting: Family Medicine

## 2020-04-10 ENCOUNTER — Other Ambulatory Visit: Payer: Self-pay

## 2020-04-10 ENCOUNTER — Encounter: Payer: Self-pay | Admitting: Family Medicine

## 2020-04-10 VITALS — BP 130/80 | HR 72 | Ht 70.0 in | Wt 219.0 lb

## 2020-04-10 DIAGNOSIS — K922 Gastrointestinal hemorrhage, unspecified: Secondary | ICD-10-CM | POA: Diagnosis not present

## 2020-04-10 DIAGNOSIS — K2970 Gastritis, unspecified, without bleeding: Secondary | ICD-10-CM

## 2020-04-10 DIAGNOSIS — F1010 Alcohol abuse, uncomplicated: Secondary | ICD-10-CM | POA: Diagnosis not present

## 2020-04-10 LAB — HEMOCCULT GUIAC POC 1CARD (OFFICE): Fecal Occult Blood, POC: NEGATIVE

## 2020-04-10 MED ORDER — DEXLANSOPRAZOLE 60 MG PO CPDR: 60.0000 mg | DELAYED_RELEASE_CAPSULE | Freq: Every day | ORAL | 1 refills | Status: DC

## 2020-04-10 NOTE — Progress Notes (Signed)
Date:  04/10/2020   Name:  Kurt Patel.   DOB:  02/21/1976   MRN:  [id number]   Chief Complaint: Follow-up (had bright red blood on Thursday of last week in toilet. No abdominal pain, but had gas that smelled like a "dead rotten animal" that day. Was advised to see GI.)  GI Problem The primary symptoms include abdominal pain, diarrhea and hematochezia. Primary symptoms do not include fever, weight loss, fatigue, nausea, vomiting, melena, hematemesis, jaundice, dysuria, myalgias, arthralgias or rash.  The illness does not include chills, anorexia, dysphagia, odynophagia, bloating, constipation, tenesmus, back pain or itching. Significant associated medical issues include alcohol abuse. Associated medical issues do not include inflammatory bowel disease, GERD, gallstones, liver disease, PUD, gastric bypass, bowel resection, irritable bowel syndrome or hemorrhoids. Risk factors for a gastrointestinal illness include alcohol use.  Rectal Bleeding  The current episode started 5 to 7 days ago. The onset was sudden. The problem has been resolved (guaiac negative now). The patient is experiencing no pain. The stool is described as mixed with blood. Associated symptoms include abdominal pain and diarrhea. Pertinent negatives include no anorexia, no fever, no hematemesis, no nausea, no rectal pain, no vomiting, no hematuria, no chest pain, no headaches, no coughing and no rash. His past medical history does not include inflammatory bowel disease.    Lab Results  Component Value Date   CREATININE 1.24 04/05/2020   BUN 17 04/05/2020   NA 141 04/05/2020   K 4.6 04/05/2020   CL 107 04/05/2020   CO2 29 04/05/2020   Lab Results  Component Value Date   CHOL 186 07/16/2017   HDL 48 07/16/2017   LDLCALC 114 (H) 07/16/2017   TRIG 122 07/16/2017   CHOLHDL 3.9 07/16/2017   No results found for: TSH No results found for: HGBA1C Lab Results  Component Value Date   WBC 12.9 (H) 04/05/2020   HGB  16.8 04/05/2020   HCT 48.1 04/05/2020   MCV 90.1 04/05/2020   PLT 276 04/05/2020   Lab Results  Component Value Date   ALT 41 04/05/2020   AST 25 04/05/2020   ALKPHOS 76 04/05/2020   BILITOT 0.8 04/05/2020     Review of Systems  Constitutional: Negative for chills, fatigue, fever and weight loss.  HENT: Negative for drooling, ear discharge, ear pain and sore throat.   Respiratory: Negative for cough, shortness of breath and wheezing.   Cardiovascular: Negative for chest pain, palpitations and leg swelling.  Gastrointestinal: Positive for abdominal pain, anal bleeding, diarrhea and hematochezia. Negative for anorexia, bloating, blood in stool, constipation, dysphagia, hematemesis, jaundice, melena, nausea, rectal pain and vomiting.  Endocrine: Negative for polydipsia.  Genitourinary: Negative for dysuria, frequency, hematuria and urgency.  Musculoskeletal: Negative for arthralgias, back pain, myalgias and neck pain.  Skin: Negative for itching and rash.  Allergic/Immunologic: Negative for environmental allergies.  Neurological: Negative for dizziness and headaches.  Hematological: Does not bruise/bleed easily.  Psychiatric/Behavioral: Negative for suicidal ideas. The patient is not nervous/anxious.     Patient Active Problem List   Diagnosis Date Noted  . Heartburn   . Adhesive capsulitis 11/21/2015  . Glenoid labral tear 09/06/2015  . Shoulder tendinitis 08/17/2015    Allergies  Allergen Reactions  . Buspar [Buspirone] Other (See Comments)    "loopy"  . Prednisone Other (See Comments)    "makes me angry"    Past Surgical History:  Procedure Laterality Date  . ESOPHAGOGASTRODUODENOSCOPY (EGD) WITH PROPOFOL N/A 06/05/2016  Procedure: ESOPHAGOGASTRODUODENOSCOPY (EGD) WITH PROPOFOL;  Surgeon: [name], MD;  Location: [facility];  Service: Endoscopy;  Laterality: N/A;  . HIP SURGERY    . SHOULDER ARTHROSCOPY Right 09/05/2015   Procedure: ARTHROSCOPY SHOULDER  DEBRIEDMENT and decompression;  Surgeon: Corky Mull, MD;  Location: [facility];  Service: Orthopedics;  Laterality: Right;  . SKIN CANCER EXCISION     back  . THROAT SURGERY  12/14/2014   polyps removed- benign    Social History   Tobacco Use  . Smoking status: Current Some Day Smoker    Packs/day: 1.00    Years: 20.00    Pack years: 20.00    Types: Cigarettes  . Smokeless tobacco: Never Used  Substance Use Topics  . Alcohol use: Yes    Alcohol/week: 12.0 standard drinks    Types: 12 Cans of beer per week    Comment: 2-8 daily  . Drug use: No     Medication list has been reviewed and updated.  Current Meds  Medication Sig  . dexlansoprazole (DEXILANT) 60 MG capsule Take 60 mg by mouth daily.  . [DISCONTINUED] Glucosamine-MSM-Hyaluronic Acd (JOINT HEALTH PO) Take 3 tablets by mouth daily.    PHQ 2/9 Scores 07/16/2017 07/16/2017  PHQ - 2 Score 0 0  PHQ- 9 Score 2 -    BP Readings from Last 3 Encounters:  04/10/20 130/80  04/05/20 134/79  02/04/18 (!) 140/100    Physical Exam Vitals and nursing note reviewed.  HENT:     Head: Normocephalic.     Right Ear: Tympanic membrane, ear canal and external ear normal.     Left Ear: Tympanic membrane, ear canal and external ear normal.     Nose: Nose normal.  Eyes:     General: No scleral icterus.       Right eye: No discharge.        Left eye: No discharge.     Conjunctiva/sclera: Conjunctivae normal.     Pupils: Pupils are equal, round, and reactive to light.  Neck:     Thyroid: No thyromegaly.     Vascular: No JVD.     Trachea: No tracheal deviation.  Cardiovascular:     Rate and Rhythm: Normal rate and regular rhythm.     Heart sounds: Normal heart sounds. No murmur. No friction rub. No gallop.   Pulmonary:     Effort: No respiratory distress.     Breath sounds: Normal breath sounds. No wheezing, rhonchi or rales.  Abdominal:     General: Bowel sounds are normal.     Palpations: Abdomen is soft. There  is no mass.     Tenderness: There is no abdominal tenderness. There is no guarding or rebound.  Genitourinary:    Rectum: Normal. Guaiac result negative.  Musculoskeletal:        General: No tenderness. Normal range of motion.     Cervical back: Normal range of motion and neck supple.  Lymphadenopathy:     Cervical: No cervical adenopathy.  Skin:    General: Skin is warm.     Findings: No rash.  Neurological:     Mental Status: He is alert and oriented to person, place, and time.     Cranial Nerves: No cranial nerve deficit.     Deep Tendon Reflexes: Reflexes are normal and symmetric.     Wt Readings from Last 3 Encounters:  04/10/20 219 lb (99.3 kg)  04/05/20 220 lb (99.8 kg)  02/04/18 227 lb (103  kg)    BP 130/80   Pulse 72   Ht 5\' 10"  (1.778 m)   Wt 219 lb (99.3 kg)   BMI 31.42 kg/m   Assessment and Plan: 1. Gastrointestinal hemorrhage, unspecified gastrointestinal hemorrhage type Patient was seen in the emergency room with a lower GI bleed versus upper GI bleed with rapid transit.  This is associated also with midepigastric discomfort and increased bowel sounds.  CBC was unremarkable as well as other labs.  Review of patient's past medical history he had an upper GI several years ago that his mother took him for that was done at the hospital and a relatively recent upper GI endoscopy per Dr. Verl Blalock in 2017.  I been able to find the results of this.  Today we will verify stability with a CBC and guaiac was noted to be negative today.  Patient has been referred to GI today for reevaluation for possible endoscopy and/or colonoscopy given the circumstances noted previously - CBC with Differential/Platelet - Basic metabolic panel - Ambulatory referral to Gastroenterology  2. Gastritis, presence of bleeding unspecified, unspecified chronicity, unspecified gastritis type Patient has a history of gastritis for which the emergency room put him on Dexilant.  This is the only PPI that  seems to have been helpful.  However patient is unable to afford the first time he went to get it was $400 the second time was $900 at this radius going to be seen multithousand dollars.  Referral to GI that may be able to assist him in getting an alternative or help him with obtaining Dexilant. - POCT occult blood stool  3. Alcohol abuse Patient has a history of alcohol use which is probably beyond what he alluded to in the emergency room.  Patient does drink beer and it does not seem because beyond this.  Patient has been instructed to be careful with this intake as this may cause bleeding ulcers and contribute to GI bleeding.

## 2020-04-11 LAB — BASIC METABOLIC PANEL
BUN/Creatinine Ratio: 9 (ref 9–20)
BUN: 12 mg/dL (ref 6–24)
CO2: 28 mmol/L (ref 20–29)
Calcium: 9.7 mg/dL (ref 8.7–10.2)
Chloride: 103 mmol/L (ref 96–106)
Creatinine, Ser: 1.36 mg/dL — ABNORMAL HIGH (ref 0.76–1.27)
GFR calc Af Amer: 74 mL/min/{1.73_m2} (ref >59)
GFR calc non Af Amer: 64 mL/min/{1.73_m2} (ref >59)
Glucose: 87 mg/dL (ref 65–99)
Potassium: 5.1 mmol/L (ref 3.5–5.2)
Sodium: 143 mmol/L (ref 134–144)

## 2020-04-11 LAB — CBC WITH DIFFERENTIAL/PLATELET
Basophils Absolute: 0.1 10*3/uL (ref 0.0–0.2)
Basos: 1 %
EOS (ABSOLUTE): 0.3 10*3/uL (ref 0.0–0.4)
Eos: 3 %
Hematocrit: 49.1 % (ref 37.5–51.0)
Hemoglobin: 16.5 g/dL (ref 13.0–17.7)
Immature Grans (Abs): 0 10*3/uL (ref 0.0–0.1)
Immature Granulocytes: 0 %
Lymphocytes Absolute: 3.6 10*3/uL — ABNORMAL HIGH (ref 0.7–3.1)
Lymphs: 38 %
MCH: 30.7 pg (ref 26.6–33.0)
MCHC: 33.6 g/dL (ref 31.5–35.7)
MCV: 91 fL (ref 79–97)
Monocytes Absolute: 0.8 10*3/uL (ref 0.1–0.9)
Monocytes: 8 %
Neutrophils Absolute: 4.8 10*3/uL (ref 1.4–7.0)
Neutrophils: 50 %
Platelets: 307 10*3/uL (ref 150–450)
RBC: 5.37 x10E6/uL (ref 4.14–5.80)
RDW: 12.9 % (ref 11.6–15.4)
WBC: 9.5 10*3/uL (ref 3.4–10.8)

## 2020-05-17 ENCOUNTER — Ambulatory Visit (INDEPENDENT_AMBULATORY_CARE_PROVIDER_SITE_OTHER): Payer: BC Managed Care – PPO | Admitting: Gastroenterology

## 2020-05-17 ENCOUNTER — Other Ambulatory Visit: Payer: Self-pay

## 2020-05-17 ENCOUNTER — Encounter: Payer: Self-pay | Admitting: Gastroenterology

## 2020-05-17 VITALS — BP 124/84 | HR 85 | Temp 97.3°F | Ht 70.0 in | Wt 219.6 lb

## 2020-05-17 DIAGNOSIS — K921 Melena: Secondary | ICD-10-CM | POA: Diagnosis not present
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
## 2020-05-18 ENCOUNTER — Other Ambulatory Visit: Payer: Self-pay

## 2020-05-18 DIAGNOSIS — K921 Melena: Secondary | ICD-10-CM

## 2020-07-13 ENCOUNTER — Ambulatory Visit: Admit: 2020-07-13 | Payer: BC Managed Care – PPO | Admitting: Gastroenterology

## 2020-07-13 SURGERY — COLONOSCOPY WITH PROPOFOL
Anesthesia: Choice

## 2020-07-25 ENCOUNTER — Other Ambulatory Visit: Payer: Self-pay

## 2020-07-25 DIAGNOSIS — R7989 Other specified abnormal findings of blood chemistry: Secondary | ICD-10-CM

## 2020-07-26 LAB — RENAL FUNCTION PANEL
Albumin: 4.5 g/dL (ref 4.0–5.0)
BUN/Creatinine Ratio: 10 (ref 9–20)
BUN: 12 mg/dL (ref 6–24)
CO2: 25 mmol/L (ref 20–29)
Calcium: 9.3 mg/dL (ref 8.7–10.2)
Chloride: 101 mmol/L (ref 96–106)
Creatinine, Ser: 1.19 mg/dL (ref 0.76–1.27)
GFR calc Af Amer: 87 mL/min/{1.73_m2} (ref >59)
GFR calc non Af Amer: 75 mL/min/{1.73_m2} (ref >59)
Glucose: 78 mg/dL (ref 65–99)
Phosphorus: 2.9 mg/dL (ref 2.8–4.1)
Potassium: 4.1 mmol/L (ref 3.5–5.2)
Sodium: 140 mmol/L (ref 134–144)

## 2021-01-17 ENCOUNTER — Encounter: Payer: Self-pay | Admitting: Family Medicine

## 2021-01-17 ENCOUNTER — Other Ambulatory Visit: Payer: Self-pay

## 2021-01-17 ENCOUNTER — Ambulatory Visit (INDEPENDENT_AMBULATORY_CARE_PROVIDER_SITE_OTHER): Payer: 59 | Admitting: Family Medicine

## 2021-01-17 VITALS — BP 140/100 | HR 72 | Ht 70.0 in | Wt 222.0 lb

## 2021-01-17 DIAGNOSIS — I10 Essential (primary) hypertension: Secondary | ICD-10-CM

## 2021-01-17 DIAGNOSIS — Z0000 Encounter for general adult medical examination without abnormal findings: Secondary | ICD-10-CM

## 2021-01-17 LAB — POCT URINALYSIS DIPSTICK
Bilirubin, UA: NEGATIVE
Blood, UA: NEGATIVE
Glucose, UA: NEGATIVE
Ketones, UA: NEGATIVE
Leukocytes, UA: NEGATIVE
Nitrite, UA: NEGATIVE
Protein, UA: NEGATIVE
Spec Grav, UA: 1.01 (ref 1.010–1.025)
Urobilinogen, UA: 0.2 E.U./dL
pH, UA: 7.5 (ref 5.0–8.0)

## 2021-01-17 MED ORDER — HYDROCHLOROTHIAZIDE 12.5 MG PO TABS: 12.5000 mg | ORAL_TABLET | Freq: Every day | ORAL | 0 refills | Status: DC

## 2021-01-17 NOTE — Patient Instructions (Signed)
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
## 2021-01-21 ENCOUNTER — Encounter: Payer: Self-pay | Admitting: Family Medicine

## 2021-01-21 ENCOUNTER — Ambulatory Visit (INDEPENDENT_AMBULATORY_CARE_PROVIDER_SITE_OTHER): Payer: 59 | Admitting: Family Medicine

## 2021-01-21 ENCOUNTER — Other Ambulatory Visit: Payer: Self-pay

## 2021-01-21 VITALS — BP 144/100 | HR 84 | Ht 70.0 in | Wt 220.0 lb

## 2021-01-21 DIAGNOSIS — F5102 Adjustment insomnia: Secondary | ICD-10-CM | POA: Diagnosis not present

## 2021-01-21 DIAGNOSIS — I10 Essential (primary) hypertension: Secondary | ICD-10-CM

## 2021-01-21 DIAGNOSIS — G44209 Tension-type headache, unspecified, not intractable: Secondary | ICD-10-CM | POA: Diagnosis not present

## 2021-01-21 MED ORDER — TRAZODONE HCL 50 MG PO TABS: 25.0000 mg | ORAL_TABLET | Freq: Every evening | ORAL | 3 refills | Status: DC | PRN

## 2021-01-21 MED ORDER — LOSARTAN POTASSIUM 50 MG PO TABS: 50.0000 mg | ORAL_TABLET | Freq: Every day | ORAL | 0 refills | Status: DC
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
## 2021-01-21 NOTE — Patient Instructions (Signed)
How to Take Your Blood Pressure Blood pressure measures how strongly your blood is pressing against the walls of your arteries. Arteries are blood vessels that carry blood from your heart throughout your body. You can take your blood pressure at home with a machine. You may need to check your blood pressure at home:  To check if you have high blood pressure (hypertension).  To check your blood pressure over time.  To make sure your blood pressure medicine is working. Supplies needed:  Blood pressure machine, or monitor.  Dining room chair to sit in.  Table or desk.  Small notebook.  Pencil or pen. How to prepare Avoid these things for 30 minutes before checking your blood pressure:  Having drinks with caffeine in them, such as coffee or tea.  Drinking alcohol.  Eating.  Smoking.  Exercising. Do these things five minutes before checking your blood pressure:  Go to the bathroom and pee (urinate).  Sit in a dining chair. Do not sit in a soft couch or an armchair.  Be quiet. Do not talk. How to take your blood pressure Follow the instructions that came with your machine. If you have a digital blood pressure monitor, these may be the instructions: 1. Sit up straight. 2. Place your feet on the floor. Do not cross your ankles or legs. 3. Rest your left arm at the level of your heart. You may rest it on a table, desk, or chair. 4. Pull up your shirt sleeve. 5. Wrap the blood pressure cuff around the upper part of your left arm. The cuff should be 1 inch (2.5 cm) above your elbow. It is best to wrap the cuff around bare skin. 6. Fit the cuff snugly around your arm. You should be able to place only one finger between the cuff and your arm. 7. Place the cord so that it rests in the bend of your elbow. 8. Press the power button. 9. Sit quietly while the cuff fills with air and loses air. 10. Write down the numbers on the screen. 11. Wait 2-3 minutes and then repeat steps 1-10.    What do the numbers mean? Two numbers make up your blood pressure. The first number is called systolic pressure. The second is called diastolic pressure. An example of a blood pressure reading is "120 over 80" (or 120/80). If you are an adult and do not have a medical condition, use this guide to find out if your blood pressure is normal: Normal  First number: below 120.  Second number: below 80. Elevated  First number: 120-129.  Second number: below 80. Hypertension stage 1  First number: 130-139.  Second number: 80-89. Hypertension stage 2  First number: 140 or above.  Second number: 90 or above. Your blood pressure is above normal even if only the top or bottom number is above normal. Follow these instructions at home:  Check your blood pressure as often as your doctor tells you to.  Check your blood pressure at the same time every day.  Take your monitor to your next doctor's appointment. Your doctor will: ? Make sure you are using it correctly. ? Make sure it is working right.  Make sure you understand what your blood pressure numbers should be.  Tell your doctor if your medicine is causing side effects.  Keep all follow-up visits as told by your doctor. This is important. General tips:  You will need a blood pressure machine, or monitor. Your doctor can suggest a   monitor. You can buy one at a drugstore or online. When choosing one: ? Choose one with an arm cuff. ? Choose one that wraps around your upper arm. Only one finger should fit between your arm and the cuff. ? Do not choose one that measures your blood pressure from your wrist or finger. Where to find more information American Heart Association: www.heart.org Contact a doctor if:  Your blood pressure keeps being high. Get help right away if:  Your first blood pressure number is higher than 180.  Your second blood pressure number is higher than 120. Summary  Check your blood pressure at the same  time every day.  Avoid caffeine, alcohol, smoking, and exercise for 30 minutes before checking your blood pressure.  Make sure you understand what your blood pressure numbers should be. This information is not intended to replace advice given to you by your health care provider. Make sure you discuss any questions you have with your health care provider. Document Revised: 11/18/2019 Document Reviewed: 11/18/2019 Elsevier Patient Education  2021 Elsevier Inc.  

## 2021-01-22 ENCOUNTER — Other Ambulatory Visit: Payer: Self-pay

## 2021-01-22 DIAGNOSIS — I10 Essential (primary) hypertension: Secondary | ICD-10-CM

## 2021-01-22 MED ORDER — VALSARTAN 80 MG PO TABS: 80.0000 mg | ORAL_TABLET | Freq: Every day | ORAL | 0 refills | Status: DC

## 2021-01-22 NOTE — Progress Notes (Unsigned)
Called in medication valsartan in place of losartan

## 2021-02-14 ENCOUNTER — Other Ambulatory Visit: Payer: Self-pay | Admitting: Family Medicine

## 2021-02-14 DIAGNOSIS — F5102 Adjustment insomnia: Secondary | ICD-10-CM

## 2021-02-14 NOTE — Telephone Encounter (Signed)
Called pt he stated that he didn't ask for a refill for medication.  KP

## 2021-02-14 NOTE — Telephone Encounter (Signed)
Requested medication (s) are due for refill today:   Yes  Requested medication (s) are on the active medication list:   Yes  Future visit scheduled:   Yes   Last ordered: 01/31/2021 #30, 3 refills  Clinic note:   Returned because pharmacy requesting a 90 day supply and a DX Code.    Requested Prescriptions  Pending Prescriptions Disp Refills   traZODone (DESYREL) 50 MG tablet [Pharmacy Med Name: TRAZODONE 50 MG TABLET] 90 tablet 2    Sig: TAKE 0.5-1 TABLETS BY MOUTH AT BEDTIME AS NEEDED FOR SLEEP.      Psychiatry: Antidepressants - Serotonin Modulator Passed - 02/14/2021 10:32 AM      Passed - Valid encounter within last 6 months    Recent Outpatient Visits           3 weeks ago Primary hypertension   [facility] Juline Patch, MD   4 weeks ago Annual physical exam   [facility] Juline Patch, MD   10 months ago Gastrointestinal hemorrhage, unspecified gastrointestinal hemorrhage type   [facility] Juline Patch, MD   3 years ago Acute maxillary sinusitis, recurrence not specified   [facility] Juline Patch, MD   3 years ago Annual physical exam   [facility] Juline Patch, MD       Future Appointments             In 1 month Juline Patch, MD [facility], [facility]

## 2021-02-15 ENCOUNTER — Encounter: Payer: Self-pay | Admitting: Emergency Medicine

## 2021-02-15 ENCOUNTER — Ambulatory Visit
Admission: EM | Admit: 2021-02-15 | Discharge: 2021-02-15 | Disposition: A | Discharge status: Home / Self Care | Payer: 59 | Attending: Family Medicine | Admitting: Family Medicine

## 2021-02-15 ENCOUNTER — Other Ambulatory Visit: Payer: Self-pay

## 2021-02-15 ENCOUNTER — Ambulatory Visit (INDEPENDENT_AMBULATORY_CARE_PROVIDER_SITE_OTHER): Payer: 59

## 2021-02-15 DIAGNOSIS — R059 Cough, unspecified: Secondary | ICD-10-CM

## 2021-02-15 DIAGNOSIS — R0789 Other chest pain: Secondary | ICD-10-CM | POA: Insufficient documentation

## 2021-02-15 DIAGNOSIS — R079 Chest pain, unspecified: Secondary | ICD-10-CM | POA: Diagnosis not present
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
## 2021-02-15 NOTE — Discharge Instructions (Addendum)
Your EKG and chest x ray was normal I believe this is muscular You can take ibuprofen or tylenol for pain Rest, stretching.  Follow up as needed for continued or worsening symptoms

## 2021-02-15 NOTE — ED Triage Notes (Signed)
Pt presents today with right sided chest pain with cough x 3 days. Denies injury.

## 2021-02-25 ENCOUNTER — Other Ambulatory Visit: Payer: Self-pay

## 2021-02-25 DIAGNOSIS — K2970 Gastritis, unspecified, without bleeding: Secondary | ICD-10-CM

## 2021-02-25 MED ORDER — DEXLANSOPRAZOLE 60 MG PO CPDR: 60.0000 mg | DELAYED_RELEASE_CAPSULE | Freq: Every day | ORAL | 1 refills | Status: DC

## 2021-02-27 ENCOUNTER — Encounter: Payer: Self-pay | Admitting: Family Medicine

## 2021-02-27 ENCOUNTER — Other Ambulatory Visit: Payer: Self-pay

## 2021-02-27 ENCOUNTER — Ambulatory Visit (INDEPENDENT_AMBULATORY_CARE_PROVIDER_SITE_OTHER): Payer: 59 | Admitting: Family Medicine

## 2021-02-27 VITALS — BP 120/80 | HR 80 | Ht 70.0 in | Wt 219.0 lb

## 2021-02-27 DIAGNOSIS — R12 Heartburn: Secondary | ICD-10-CM | POA: Diagnosis not present

## 2021-02-27 DIAGNOSIS — I10 Essential (primary) hypertension: Secondary | ICD-10-CM | POA: Diagnosis not present

## 2021-02-27 DIAGNOSIS — M5030 Other cervical disc degeneration, unspecified cervical region: Secondary | ICD-10-CM | POA: Diagnosis not present

## 2021-02-27 DIAGNOSIS — R054 Cough syncope: Secondary | ICD-10-CM

## 2021-02-27 DIAGNOSIS — K2970 Gastritis, unspecified, without bleeding: Secondary | ICD-10-CM

## 2021-02-27 DIAGNOSIS — E669 Obesity, unspecified: Secondary | ICD-10-CM

## 2021-02-27 DIAGNOSIS — G5603 Carpal tunnel syndrome, bilateral upper limbs: Secondary | ICD-10-CM

## 2021-02-27 MED ORDER — DEXLANSOPRAZOLE 60 MG PO CPDR: 60.0000 mg | DELAYED_RELEASE_CAPSULE | Freq: Every day | ORAL | 5 refills | Status: DC

## 2021-02-27 MED ORDER — VALSARTAN 80 MG PO TABS
80.0000 mg | ORAL_TABLET | Freq: Every day | ORAL | 0 refills | Status: DC
Start: 2021-02-27 — End: 2021-07-03

## 2021-02-27 MED ORDER — HYDROCHLOROTHIAZIDE 12.5 MG PO TABS: 12.5000 mg | ORAL_TABLET | Freq: Every day | ORAL | 0 refills | Status: DC
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
## 2021-04-04 ENCOUNTER — Ambulatory Visit: Payer: 59 | Admitting: Family Medicine

## 2021-06-21 ENCOUNTER — Other Ambulatory Visit: Payer: Self-pay | Admitting: Family Medicine

## 2021-06-21 DIAGNOSIS — I10 Essential (primary) hypertension: Secondary | ICD-10-CM

## 2021-07-03 ENCOUNTER — Other Ambulatory Visit: Payer: Self-pay | Admitting: Family Medicine

## 2021-07-03 DIAGNOSIS — I10 Essential (primary) hypertension: Secondary | ICD-10-CM

## 2021-07-03 NOTE — Telephone Encounter (Signed)
Requested Prescriptions  Pending Prescriptions Disp Refills  . valsartan (DIOVAN) 80 MG tablet [Pharmacy Med Name: VALSARTAN 80 MG TABLET] 90 tablet 0    Sig: TAKE 1 TABLET BY MOUTH EVERY DAY     Cardiovascular:  Angiotensin Receptor Blockers Failed - 07/03/2021  3:05 AM      Failed - Cr in normal range and within 180 days    Creatinine  Date Value Ref Range Status  05/22/2014 1.33 (H) 0.60 - 1.30 mg/dL Final   Creatinine, Ser  Date Value Ref Range Status  07/25/2020 1.19 0.76 - 1.27 mg/dL Final         Failed - K in normal range and within 180 days    Potassium  Date Value Ref Range Status  07/25/2020 4.1 3.5 - 5.2 mmol/L Final  05/22/2014 4.1 3.5 - 5.1 mmol/L Final         Passed - Patient is not pregnant      Passed - Last BP in normal range    BP Readings from Last 1 Encounters:  02/27/21 120/80         Passed - Valid encounter within last 6 months    Recent Outpatient Visits          4 months ago Primary hypertension   [facility] Juline Patch, MD   5 months ago Primary hypertension   [facility] Juline Patch, MD   5 months ago Annual physical exam   [facility] Juline Patch, MD   1 year ago Gastrointestinal hemorrhage, unspecified gastrointestinal hemorrhage type   [facility] Juline Patch, MD   3 years ago Acute maxillary sinusitis, recurrence not specified   [facility] Juline Patch, MD      Future Appointments            In 1 month Juline Patch, MD [facility], [facility]

## 2021-08-22 ENCOUNTER — Other Ambulatory Visit: Payer: Self-pay | Admitting: Family Medicine

## 2021-08-22 DIAGNOSIS — K2970 Gastritis, unspecified, without bleeding: Secondary | ICD-10-CM

## 2021-08-30 ENCOUNTER — Ambulatory Visit: Payer: 59 | Admitting: Family Medicine

## 2021-08-30 ENCOUNTER — Encounter: Payer: Self-pay | Admitting: Family Medicine

## 2021-08-30 ENCOUNTER — Other Ambulatory Visit: Payer: Self-pay

## 2021-08-30 VITALS — BP 130/80 | HR 80 | Ht 70.0 in | Wt 223.0 lb

## 2021-08-30 DIAGNOSIS — I10 Essential (primary) hypertension: Secondary | ICD-10-CM | POA: Diagnosis not present

## 2021-08-30 DIAGNOSIS — R7303 Prediabetes: Secondary | ICD-10-CM

## 2021-08-30 DIAGNOSIS — K2970 Gastritis, unspecified, without bleeding: Secondary | ICD-10-CM

## 2021-08-30 MED ORDER — DEXLANSOPRAZOLE 60 MG PO CPDR: 60.0000 mg | DELAYED_RELEASE_CAPSULE | Freq: Every day | ORAL | 1 refills | Status: DC

## 2021-08-30 MED ORDER — VALSARTAN 80 MG PO TABS: 80.0000 mg | ORAL_TABLET | Freq: Every day | ORAL | 1 refills | Status: DC

## 2021-08-30 MED ORDER — HYDROCHLOROTHIAZIDE 12.5 MG PO TABS: 12.5000 mg | ORAL_TABLET | Freq: Every day | ORAL | 1 refills | Status: DC
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
## 2021-08-30 NOTE — Patient Instructions (Signed)
Carbohydrate Counting for Diabetes Mellitus, Adult Carbohydrate counting is a method of keeping track of how many carbohydrates you eat. Eating carbohydrates naturally increases the amount of sugar (glucose) in the blood. Counting how many carbohydrates you eat improves your blood glucose control, which helps you manage your diabetes. It is important to know how many carbohydrates you can safely have in each meal. This is different for every person. A dietitian can help you make a meal plan and calculate how many carbohydrates you should have at each meal and snack. What foods contain carbohydrates? Carbohydrates are found in the following foods: Grains, such as breads and cereals. Dried beans and soy products. Starchy vegetables, such as potatoes, peas, and corn. Fruit and fruit juices. Milk and yogurt. Sweets and snack foods, such as cake, cookies, candy, chips, and soft drinks. How do I count carbohydrates in foods? There are two ways to count carbohydrates in food. You can read food labels or learn standard serving sizes of foods. You can use either of the methods or a combination of both. Using the Nutrition Facts label The Nutrition Facts list is included on the labels of almost all packaged foods and beverages in the [place] It includes: The serving size. Information about nutrients in each serving, including the grams (g) of carbohydrate per serving. To use the Nutrition Facts: Decide how many servings you will have. Multiply the number of servings by the number of carbohydrates per serving. The resulting number is the total amount of carbohydrates that you will be having. Learning the standard serving sizes of foods When you eat carbohydrate foods that are not packaged or do not include Nutrition Facts on the label, you need to measure the servings in order to count the amount of carbohydrates. Measure the foods that you will eat with a food scale or measuring cup, if needed. Decide how  many standard-size servings you will eat. Multiply the number of servings by 15. For foods that contain carbohydrates, one serving equals 15 g of carbohydrates. For example, if you eat 2 cups or 10 oz (300 g) of strawberries, you will have eaten 2 servings and 30 g of carbohydrates (2 servings x 15 g = 30 g). For foods that have more than one food mixed, such as soups and casseroles, you must count the carbohydrates in each food that is included. The following list contains standard serving sizes of common carbohydrate-rich foods. Each of these servings has about 15 g of carbohydrates: 1 slice of bread. 1 six-inch (15 cm) tortilla. ? cup or 2 oz (53 g) cooked rice or pasta.  cup or 3 oz (85 g) cooked or canned, drained and rinsed beans or lentils.  cup or 3 oz (85 g) starchy vegetable, such as peas, corn, or squash.  cup or 4 oz (120 g) hot cereal.  cup or 3 oz (85 g) boiled or mashed potatoes, or  or 3 oz (85 g) of a large baked potato.  cup or 4 fl oz (118 mL) fruit juice. 1 cup or 8 fl oz (237 mL) milk. 1 small or 4 oz (106 g) apple.  or 2 oz (63 g) of a medium banana. 1 cup or 5 oz (150 g) strawberries. 3 cups or 1 oz (24 g) popped popcorn. What is an example of carbohydrate counting? To calculate the number of carbohydrates in this sample meal, follow the steps shown below. Sample meal 3 oz (85 g) chicken breast. ? cup or 4 oz (106 g) brown   rice.  cup or 3 oz (85 g) corn. 1 cup or 8 fl oz (237 mL) milk. 1 cup or 5 oz (150 g) strawberries with sugar-free whipped topping. Carbohydrate calculation Identify the foods that contain carbohydrates: Rice. Corn. Milk. Strawberries. Calculate how many servings you have of each food: 2 servings rice. 1 serving corn. 1 serving milk. 1 serving strawberries. Multiply each number of servings by 15 g: 2 servings rice x 15 g = 30 g. 1 serving corn x 15 g = 15 g. 1 serving milk x 15 g = 15 g. 1 serving strawberries x 15 g = 15  g. Add together all of the amounts to find the total grams of carbohydrates eaten: 30 g + 15 g + 15 g + 15 g = 75 g of carbohydrates total. What are tips for following this plan? Shopping Develop a meal plan and then make a shopping list. Buy fresh and frozen vegetables, fresh and frozen fruit, dairy, eggs, beans, lentils, and whole grains. Look at food labels. Choose foods that have more fiber and less sugar. Avoid processed foods and foods with added sugars. Meal planning Aim to have the same amount of carbohydrates at each meal and for each snack time. Plan to have regular, balanced meals and snacks. Where to find more information American Diabetes Association: www.diabetes.org Centers for Disease Control and Prevention: www.cdc.gov Summary Carbohydrate counting is a method of keeping track of how many carbohydrates you eat. Eating carbohydrates naturally increases the amount of sugar (glucose) in the blood. Counting how many carbohydrates you eat improves your blood glucose control, which helps you manage your diabetes. A dietitian can help you make a meal plan and calculate how many carbohydrates you should have at each meal and snack. This information is not intended to replace advice given to you by your health care provider. Make sure you discuss any questions you have with your health care provider. Document Revised: 11/24/2019 Document Reviewed: 11/25/2019 Elsevier Patient Education  2021 [employer]  

## 2021-08-31 LAB — RENAL FUNCTION PANEL
Albumin: 4.5 g/dL (ref 4.0–5.0)
BUN/Creatinine Ratio: 11 (ref 9–20)
BUN: 13 mg/dL (ref 6–24)
CO2: 22 mmol/L (ref 20–29)
Calcium: 9.2 mg/dL (ref 8.7–10.2)
Chloride: 101 mmol/L (ref 96–106)
Creatinine, Ser: 1.17 mg/dL (ref 0.76–1.27)
Glucose: 100 mg/dL — ABNORMAL HIGH (ref 65–99)
Phosphorus: 2.7 mg/dL — ABNORMAL LOW (ref 2.8–4.1)
Potassium: 4.6 mmol/L (ref 3.5–5.2)
Sodium: 140 mmol/L (ref 134–144)
eGFR: 80 mL/min/{1.73_m2} (ref >59)

## 2021-08-31 LAB — HEMOGLOBIN A1C
Est. average glucose Bld gHb Est-mCnc: 114 mg/dL
Hgb A1c MFr Bld: 5.6 % (ref 4.8–5.6)

## 2022-02-27 ENCOUNTER — Ambulatory Visit: Payer: 59 | Admitting: Family Medicine

## 2022-02-27 ENCOUNTER — Encounter: Payer: Self-pay | Admitting: Family Medicine

## 2022-02-27 ENCOUNTER — Other Ambulatory Visit: Payer: Self-pay

## 2022-02-27 VITALS — BP 138/82 | HR 96 | Ht 70.0 in | Wt 218.0 lb

## 2022-02-27 DIAGNOSIS — R030 Elevated blood-pressure reading, without diagnosis of hypertension: Secondary | ICD-10-CM

## 2022-02-27 DIAGNOSIS — K2970 Gastritis, unspecified, without bleeding: Secondary | ICD-10-CM

## 2022-02-27 MED ORDER — DEXLANSOPRAZOLE 60 MG PO CPDR: 60.0000 mg | DELAYED_RELEASE_CAPSULE | Freq: Every day | ORAL | 1 refills | Status: DC
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
## 2022-02-27 NOTE — Patient Instructions (Signed)

## 2022-08-29 ENCOUNTER — Ambulatory Visit: Payer: 59 | Admitting: Family Medicine

## 2022-09-10 ENCOUNTER — Other Ambulatory Visit: Payer: Self-pay | Admitting: Family Medicine

## 2022-09-10 DIAGNOSIS — K2970 Gastritis, unspecified, without bleeding: Secondary | ICD-10-CM

## 2022-09-30 ENCOUNTER — Ambulatory Visit: Payer: 59 | Admitting: Family Medicine

## 2022-10-01 ENCOUNTER — Ambulatory Visit: Payer: 59 | Admitting: Family Medicine

## 2022-10-08 ENCOUNTER — Ambulatory Visit: Payer: 59 | Admitting: Family Medicine

## 2022-10-08 ENCOUNTER — Encounter: Payer: Self-pay | Admitting: Family Medicine

## 2022-10-08 DIAGNOSIS — I10 Essential (primary) hypertension: Secondary | ICD-10-CM

## 2022-10-08 DIAGNOSIS — K2970 Gastritis, unspecified, without bleeding: Secondary | ICD-10-CM

## 2022-10-08 MED ORDER — DEXLANSOPRAZOLE 60 MG PO CPDR: 60.0000 mg | DELAYED_RELEASE_CAPSULE | Freq: Every day | ORAL | 2 refills | Status: DC

## 2022-10-08 NOTE — Patient Instructions (Signed)
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
## 2023-04-08 ENCOUNTER — Encounter: Payer: Self-pay | Admitting: Family Medicine

## 2023-04-08 ENCOUNTER — Ambulatory Visit: Payer: 59 | Admitting: Family Medicine

## 2023-04-08 VITALS — BP 118/78 | HR 84 | Ht 70.0 in | Wt 226.0 lb

## 2023-04-08 DIAGNOSIS — G5601 Carpal tunnel syndrome, right upper limb: Secondary | ICD-10-CM | POA: Diagnosis not present

## 2023-04-08 DIAGNOSIS — K2970 Gastritis, unspecified, without bleeding: Secondary | ICD-10-CM

## 2023-04-08 DIAGNOSIS — E7801 Familial hypercholesterolemia: Secondary | ICD-10-CM | POA: Diagnosis not present

## 2023-04-08 MED ORDER — DEXLANSOPRAZOLE 60 MG PO CPDR: 60.0000 mg | DELAYED_RELEASE_CAPSULE | Freq: Every day | ORAL | 1 refills | Status: DC
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
## 2023-04-08 NOTE — Patient Instructions (Signed)

## 2023-04-09 LAB — COMPREHENSIVE METABOLIC PANEL
ALT: 59 IU/L — ABNORMAL HIGH (ref 0–44)
AST: 35 IU/L (ref 0–40)
Albumin/Globulin Ratio: 1.9 (ref 1.2–2.2)
Albumin: 4.3 g/dL (ref 4.1–5.1)
Alkaline Phosphatase: 82 IU/L (ref 44–121)
BUN/Creatinine Ratio: 14 (ref 9–20)
BUN: 15 mg/dL (ref 6–24)
Bilirubin Total: 0.4 mg/dL (ref 0.0–1.2)
CO2: 22 mmol/L (ref 20–29)
Calcium: 9 mg/dL (ref 8.7–10.2)
Chloride: 103 mmol/L (ref 96–106)
Creatinine, Ser: 1.1 mg/dL (ref 0.76–1.27)
Globulin, Total: 2.3 g/dL (ref 1.5–4.5)
Glucose: 93 mg/dL (ref 70–99)
Potassium: 4.6 mmol/L (ref 3.5–5.2)
Sodium: 139 mmol/L (ref 134–144)
Total Protein: 6.6 g/dL (ref 6.0–8.5)
eGFR: 85 mL/min/{1.73_m2} (ref >59)

## 2023-04-09 LAB — LIPID PANEL WITH LDL/HDL RATIO
Cholesterol, Total: 173 mg/dL (ref 100–199)
HDL: 40 mg/dL (ref >39)
LDL Chol Calc (NIH): 112 mg/dL — ABNORMAL HIGH (ref 0–99)
LDL/HDL Ratio: 2.8 ratio (ref 0.0–3.6)
Triglycerides: 115 mg/dL (ref 0–149)
VLDL Cholesterol Cal: 21 mg/dL (ref 5–40)

## 2023-05-25 ENCOUNTER — Other Ambulatory Visit: Payer: Self-pay | Admitting: Orthopedic Surgery

## 2023-05-26 ENCOUNTER — Encounter: Payer: Self-pay | Admitting: Orthopedic Surgery

## 2023-05-26 NOTE — Anesthesia Preprocedure Evaluation (Addendum)
Anesthesia Evaluation  Patient identified by MRN, date of birth, ID band Patient awake    Reviewed: Allergy & Precautions, H&P , NPO status , Patient's Chart, lab work & pertinent test results  Airway Mallampati: I  TM Distance: >3 FB Neck ROM: Full    Dental no notable dental hx.    Pulmonary Current Smoker and Patient abstained from smoking.   Pulmonary exam normal breath sounds clear to auscultation       Cardiovascular hypertension, Normal cardiovascular exam Rhythm:Regular Rate:Normal  Echocardiogram 2D complete: (05/21/2021) INTERPRETATION NORMAL LEFT VENTRICULAR SYSTOLIC FUNCTION NORMAL RIGHT VENTRICULAR SYSTOLIC FUNCTION TRIVIAL REGURGITATION NOTED (See above) NO VALVULAR STENOSIS TRIVIAL MR, TR, PR EF 50-55%     Neuro/Psych negative neurological ROS  negative psych ROS   GI/Hepatic Neg liver ROS, hiatal hernia,GERD  ,,  Endo/Other  negative endocrine ROS    Renal/GU negative Renal ROS  negative genitourinary   Musculoskeletal negative musculoskeletal ROS (+) Arthritis ,    Abdominal   Peds negative pediatric ROS (+)  Hematology negative hematology ROS (+)   Anesthesia Other Findings Wears dentures  Arthritis Vertigo Legg-Calve-Perthes disease GERD (gastroesophageal reflux disease)     Reproductive/Obstetrics negative OB ROS                              Anesthesia Physical Anesthesia Plan  ASA: 2  Anesthesia Plan: General   Post-op Pain Management:    Induction: Intravenous  PONV Risk Score and Plan:   Airway Management Planned: Oral ETT  Additional Equipment:   Intra-op Plan:   Post-operative Plan: Extubation in OR  Informed Consent: I have reviewed the patients History and Physical, chart, labs and discussed the procedure including the risks, benefits and alternatives for the proposed anesthesia with the patient or authorized representative who has  indicated his/her understanding and acceptance.     Dental Advisory Given  Plan Discussed with: Anesthesiologist, CRNA and Surgeon  Anesthesia Plan Comments: (Patient consented for risks of anesthesia including but not limited to:  - adverse reactions to medications - damage to eyes, teeth, lips or other oral mucosa - nerve damage due to positioning  - sore throat or hoarseness - Damage to heart, brain, nerves, lungs, other parts of body or loss of life  Patient voiced understanding.)        Anesthesia Quick Evaluation

## 2023-06-02 ENCOUNTER — Ambulatory Visit: Payer: 59 | Admitting: Anesthesiology

## 2023-06-02 ENCOUNTER — Other Ambulatory Visit: Payer: Self-pay

## 2023-06-02 ENCOUNTER — Encounter
Admission: RE | Disposition: A | Discharge status: Home / Self Care | Payer: Self-pay | Source: Home / Self Care | Attending: Orthopedic Surgery

## 2023-06-02 ENCOUNTER — Encounter: Payer: Self-pay | Admitting: Orthopedic Surgery

## 2023-06-02 ENCOUNTER — Ambulatory Visit
Admission: RE | Admit: 2023-06-02 | Discharge: 2023-06-02 | Disposition: A | Discharge status: Home / Self Care | Payer: 59 | Attending: Orthopedic Surgery | Admitting: Orthopedic Surgery

## 2023-06-02 ENCOUNTER — Ambulatory Visit: Admit: 2023-06-02 | Payer: 59 | Admitting: Orthopedic Surgery

## 2023-06-02 DIAGNOSIS — K219 Gastro-esophageal reflux disease without esophagitis: Secondary | ICD-10-CM | POA: Insufficient documentation

## 2023-06-02 DIAGNOSIS — F17200 Nicotine dependence, unspecified, uncomplicated: Secondary | ICD-10-CM | POA: Diagnosis not present

## 2023-06-02 DIAGNOSIS — I10 Essential (primary) hypertension: Secondary | ICD-10-CM | POA: Insufficient documentation

## 2023-06-02 DIAGNOSIS — G5603 Carpal tunnel syndrome, bilateral upper limbs: Secondary | ICD-10-CM | POA: Diagnosis present

## 2023-06-02 DIAGNOSIS — Z79899 Other long term (current) drug therapy: Secondary | ICD-10-CM | POA: Insufficient documentation

## 2023-06-02 DIAGNOSIS — E669 Obesity, unspecified: Secondary | ICD-10-CM | POA: Insufficient documentation

## 2023-06-02 DIAGNOSIS — Z6841 Body Mass Index (BMI) 40.0 and over, adult: Secondary | ICD-10-CM | POA: Insufficient documentation

## 2023-06-02 HISTORY — PX: CARPAL TUNNEL RELEASE: SHX101

## 2023-06-02 HISTORY — DX: Dysphonia: R49.0

## 2023-06-02 HISTORY — DX: Acute laryngitis: J04.0

## 2023-06-02 HISTORY — DX: Gastritis, unspecified, without bleeding: K29.70

## 2023-06-02 HISTORY — DX: Diaphragmatic hernia without obstruction or gangrene: K44.9

## 2023-06-02 HISTORY — DX: Essential (primary) hypertension: I10

## 2023-06-02 SURGERY — CARPAL TUNNEL RELEASE
Anesthesia: General | Laterality: Bilateral

## 2023-06-02 SURGERY — CARPAL TUNNEL RELEASE
Anesthesia: Choice | Site: Wrist | Laterality: Bilateral

## 2023-06-02 MED ORDER — HYDROCODONE-ACETAMINOPHEN 5-325 MG PO TABS: 1.0000 | ORAL_TABLET | ORAL | 0 refills | Status: DC | PRN

## 2023-06-02 MED ORDER — SUCCINYLCHOLINE CHLORIDE 200 MG/10ML IV SOSY
PREFILLED_SYRINGE | INTRAVENOUS | Status: DC | PRN
  Administered 2023-06-02: 140 mg via INTRAVENOUS

## 2023-06-02 MED ORDER — NICOTINE 14 MG/24HR TD PT24: 14.0000 mg | MEDICATED_PATCH | TRANSDERMAL | 0 refills | Status: DC

## 2023-06-02 MED ORDER — FENTANYL CITRATE (PF) 100 MCG/2ML IJ SOLN
INTRAMUSCULAR | Status: DC | PRN
  Administered 2023-06-02 (×4): 50 ug via INTRAVENOUS

## 2023-06-02 MED ORDER — MORPHINE SULFATE (PF) 2 MG/ML IV SOLN: 0.5000 mg | INTRAVENOUS | Status: DC | PRN

## 2023-06-02 MED ORDER — LIDOCAINE HCL (CARDIAC) PF 100 MG/5ML IV SOSY
PREFILLED_SYRINGE | INTRAVENOUS | Status: DC | PRN
  Administered 2023-06-02: 100 mg via INTRAVENOUS

## 2023-06-02 MED ORDER — MIDAZOLAM HCL 2 MG/2ML IJ SOLN
INTRAMUSCULAR | Status: DC | PRN
  Administered 2023-06-02 (×2): 1 mg via INTRAVENOUS

## 2023-06-02 MED ORDER — HYDROCODONE-ACETAMINOPHEN 5-325 MG PO TABS: 1.0000 | ORAL_TABLET | ORAL | Status: DC | PRN

## 2023-06-02 MED ORDER — ONDANSETRON HCL 4 MG/2ML IJ SOLN
INTRAMUSCULAR | Status: DC | PRN
  Administered 2023-06-02: 4 mg via INTRAVENOUS

## 2023-06-02 MED ORDER — SODIUM CHLORIDE 0.9 % IV SOLN: INTRAVENOUS | Status: DC

## 2023-06-02 MED ORDER — ROCURONIUM BROMIDE 100 MG/10ML IV SOLN
INTRAVENOUS | Status: DC | PRN
  Administered 2023-06-02: 10 mg via INTRAVENOUS

## 2023-06-02 MED ORDER — ONDANSETRON HCL 4 MG PO TABS: 4.0000 mg | ORAL_TABLET | Freq: Four times a day (QID) | ORAL | Status: DC | PRN

## 2023-06-02 MED ORDER — SUGAMMADEX SODIUM 200 MG/2ML IV SOLN
INTRAVENOUS | Status: DC | PRN
  Administered 2023-06-02: 200 mg via INTRAVENOUS

## 2023-06-02 MED ORDER — DEXAMETHASONE SODIUM PHOSPHATE 10 MG/ML IJ SOLN
INTRAMUSCULAR | Status: DC | PRN
  Administered 2023-06-02: 4 mg via INTRAVENOUS

## 2023-06-02 MED ORDER — ALBUTEROL SULFATE HFA 108 (90 BASE) MCG/ACT IN AERS
INHALATION_SPRAY | RESPIRATORY_TRACT | Status: DC | PRN
  Administered 2023-06-02: 6 via RESPIRATORY_TRACT

## 2023-06-02 MED ORDER — BUPIVACAINE HCL 0.5 % IJ SOLN
INTRAMUSCULAR | Status: DC | PRN
  Administered 2023-06-02 (×2): 10 mL

## 2023-06-02 MED ORDER — ACETAMINOPHEN 10 MG/ML IV SOLN
INTRAVENOUS | Status: DC | PRN
  Administered 2023-06-02: 1000 mg via INTRAVENOUS

## 2023-06-02 MED ORDER — ONDANSETRON HCL 4 MG/2ML IJ SOLN: 4.0000 mg | Freq: Four times a day (QID) | INTRAMUSCULAR | Status: DC | PRN

## 2023-06-02 MED ORDER — LACTATED RINGERS IV SOLN: INTRAVENOUS | Status: DC

## 2023-06-02 MED ORDER — CEFAZOLIN SODIUM-DEXTROSE 2-4 GM/100ML-% IV SOLN
2.0000 g | INTRAVENOUS | Status: AC
  Administered 2023-06-02: 2 g via INTRAVENOUS

## 2023-06-02 MED ORDER — PROPOFOL 10 MG/ML IV BOLUS
INTRAVENOUS | Status: DC | PRN
  Administered 2023-06-02: 250 mg via INTRAVENOUS

## 2023-06-02 MED ORDER — ACETAMINOPHEN 325 MG PO TABS: 325.0000 mg | ORAL_TABLET | Freq: Four times a day (QID) | ORAL | Status: DC | PRN

## 2023-06-02 MED ORDER — HYDROCODONE-ACETAMINOPHEN 7.5-325 MG PO TABS: 1.0000 | ORAL_TABLET | ORAL | Status: DC | PRN

## 2023-06-02 SURGICAL SUPPLY — 21 items
APL PRP STRL LF DISP 70% ISPRP (MISCELLANEOUS) ×2
BNDG CMPR STD VLCR NS LF 5.8X3 (GAUZE/BANDAGES/DRESSINGS) ×2
BNDG ELASTIC 3X5.8 VLCR NS LF (GAUZE/BANDAGES/DRESSINGS) IMPLANT
CHLORAPREP W/TINT 26 (MISCELLANEOUS) ×1 IMPLANT
COVER LIGHT HANDLE UNIVERSAL (MISCELLANEOUS) ×2 IMPLANT
CUFF TOURN SGL QUICK 18X4 (TOURNIQUET CUFF) IMPLANT
DRAPE EXTREMITY T 121X128X90 (DISPOSABLE) IMPLANT
DRAPE IMP U-DRAPE 54X76 (DRAPES) IMPLANT
GAUZE SPONGE 4X4 12PLY STRL (GAUZE/BANDAGES/DRESSINGS) ×1 IMPLANT
GAUZE XEROFORM 1X8 LF (GAUZE/BANDAGES/DRESSINGS) ×1 IMPLANT
GLOVE SURG SYN 9.0 PF PI (GLOVE) ×1 IMPLANT
GOWN STRL REIN 2XL XLG LVL4 (GOWN DISPOSABLE) ×2 IMPLANT
GOWN STRL REUS W/ TWL LRG LVL3 (GOWN DISPOSABLE) ×1 IMPLANT
GOWN STRL REUS W/TWL LRG LVL3 (GOWN DISPOSABLE) ×1
KIT TURNOVER KIT A (KITS) ×1 IMPLANT
PACK EXTREMITY ARMC (MISCELLANEOUS) ×1 IMPLANT
PAD CAST 4YDX4 CTTN HI CHSV (CAST SUPPLIES) ×1 IMPLANT
PADDING CAST COTTON 4X4 STRL (CAST SUPPLIES) ×2
SUT ETHILON 4-0 (SUTURE) ×1
SUT ETHILON 4-0 FS2 18XMFL BLK (SUTURE) ×1
SUTURE ETHLN 4-0 FS2 18XMF BLK (SUTURE) ×1 IMPLANT

## 2023-06-02 NOTE — Op Note (Signed)
06/02/2023  2:09 PM  PATIENT:  Kurt Patel.  47 y.o. male  PRE-OPERATIVE DIAGNOSIS:  Carpal tunnel syndrome, bilateral upper limbs G56.03  POST-OPERATIVE DIAGNOSIS:  Carpal tunnel syndrome, bilateral upper limbs G56.03  PROCEDURE:  Procedure(s): Bilateral carpal tunnel releases (Bilateral)  SURGEON: Leitha Schuller, MD  ASSISTANTS: none  ANESTHESIA:   general  EBL:  Total I/O In: -  Out: 0.5 [Blood:0.5]  BLOOD ADMINISTERED:none  DRAINS: none   LOCAL MEDICATIONS USED:  MARCAINE     SPECIMEN:  No Specimen  DISPOSITION OF SPECIMEN:  N/A  COUNTS:  YES  TOURNIQUET:  * Missing tourniquet times found for documented tourniquets in log: [id number] * Total Tourniquet Time Documented: Forearm (Right) - 9 minutes Total: Forearm (Right) - 9 minutes   IMPLANTS: None  DICTATION: .Dragon Dictation  patient was brought to the operating room and after adequate anesthesia was obtained the both arms were prepped and draped in the usual sterile fashion. After patient identification timeout procedure was completed following this the arms were prepped and draped with a tourniquet to the upper forearm.  Tourniquet was raised on the right first and incised incision was made in line with the ring metacarpal approximately 2 cm in length. Subcutaneous tissue was spread and there was some thenar musculature overlying the transverse carpal ligament this was elevated the ligament was very thickened and when it was opened with a vascular hemostat was placed deep to protect the underlying structures. Release carried out distally and then proximally in the mid carpal tunnel there was a significant area of constriction and when this was released there is good vascular blush to the nerve.  Release was carried out to about a centimeter proximal to the wrist flexion crease and there did not appear to be any other evidence of compression. 10 cc of half percent Sensorcaine plain was infiltrated around the  incision obtain postop analgesia. The wound was then closed with simple erupted 4-0 nylon skin sutures followed by Xeroform 4 x 4 web roll, and identical procedure was then carried out on the left wrist with Ace wraps applied to both  PLAN OF CARE: Discharge to home after PACU  PATIENT DISPOSITION:  PACU - hemodynamically stable.

## 2023-06-02 NOTE — H&P (Signed)
Chief Complaint Patient presents with Left Hand - Numbness Right Hand - Numbness   History of the Present Illness: Kurt Patel is a 47 y.o. male here today.  The patient is a 47 year old male with bilateral carpal tunnel syndrome, comes in today to discuss possible carpal tunnel release. He previously has seen Dr. Dedra Skeens, PA-C, in [facility] with cervical radiculopathy as well as carpal tunnel. The patient underwent an EMG nerve conduction study on 05/11/2023 showing right severe grade 4 carpal tunnel and left grade 3 carpal tunnel syndrome. No evidence of cervical radiculopathy on that test.  The patient reports difficulty in holding objects and experiences nocturnal awakenings due to his symptoms. His left middle finger is particularly problematic. He has undergone an MRI of his neck, specifically at C6 and C7. Initially, he attempted to manage his symptoms with splints which worked temporarily. He experiences morning pain but his primary pain is nocturnal. His pain is exacerbated by movement, bending, and picking up objects, which forces his hand too far. He suspects that he may have taken excessive amounts of ibuprofen and Aleve. He expresses a desire to proceed with surgical intervention bilaterally.  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  I have reviewed past medical, surgical, social and family history, and allergies as documented in the EMR.  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/21/2015 GERD (gastroesophageal reflux disease) Obesity (BMI 30.0-34.9), unspecified 11/12/2017 Skin cancer  Past Surgical History: Past Surgical History: Procedure Laterality Date HIP ARTHROSCOPY Right 2008 Polyps and Cysts Removed from Throat 12/14/14 [facility] arthroscopic  labral debridement and arthroscopic subacromial decompression, right shoulder Right 09/05/2015 Dr.Poggi Excision of Skin Cancer Back  Past Family History: Family History Problem Relation Age of Onset Diabetes Mother Hyperlipidemia (Elevated cholesterol) Mother Hyperlipidemia (Elevated cholesterol) Father Stroke Father  Medications: Current Outpatient Medications Medication Sig Dispense Refill DEXILANT 60 mg DR capsule Take 1 tablet by mouth once daily 3 ibuprofen (ADVIL,MOTRIN) 800 MG tablet Take 1 tablet (800 mg total) by mouth every 8 (eight) hours as needed for Pain 90 tablet 1  No current facility-administered medications for this visit.  Allergies: Allergies Allergen Reactions Buspirone Other (See Comments) Patient states this made him feel "loopy"  "loopy"  "loopy feeling"  Patient states this made him feel "loopy", "loopy", "loopy feeling", , "loopy feeling", "loopy feeling", "loopy" Prednisone Other (See Comments) "makes patient angry"  "it made me mean"  "makes me angry"  "makes patient angry", "it made me mean", "makes me angry", , "it made me mean", "it made me mean", "makes me angry"   Body mass index is 47.48 kg/m.  Review of Systems: A comprehensive 14 point ROS was performed, reviewed, and the pertinent orthopaedic findings are documented in the HPI.  Vitals: 05/21/23 1042 BP: 124/76   General Physical Examination:  General/Constitutional: No apparent distress: well-nourished and well developed. Eyes: Pupils equal, round with synchronous movement. Lungs: Clear to auscultation HEENT: Normal Vascular: No edema, swelling or tenderness, except as noted in detailed exam. Cardiac: Heart rate and rhythm is regular. Integumentary: No impressive skin lesions present, except as noted in detailed exam. Neuro/Psych: Normal mood and affect, oriented to person, place, and time. Musculoskeletal: Slightly weaker on the right hand. Positive Tinel sign,  right worse than left with tingling to the middle finger.  Radiographs:  No new  imaging studies were obtained or reviewed today.  Testing EMG nerve conduction study shows right severe grade 4 carpal tunnel and left grade 3 carpal tunnel syndrome. No evidence of cervical radiculopathy.  Assessment: ICD-10-CM 1. Carpal tunnel syndrome, bilateral upper limbs G56.03  Plan:  The patient has clinical findings of bilateral carpal tunnel syndrome with neck issues, specifically at C6 and C7.  A comprehensive discussion was held with the patient regarding the diagnosis of carpal tunnel syndrome and the patient viewed the carpal tunnel AAOS informative video. The potential treatment strategies, including rest, ice application, wrist splints, cortisone injections, and surgical intervention, were thoroughly discussed. He was advised he will likely be able to return to work in 4 weeks. The patient was advised against the concurrent use of ibuprofen and Aleve. A bilateral carpal tunnel release will be performed at the [facility] in [place].  Surgical Risks:  The nature of the condition and the proposed procedure has been reviewed in detail with the patient. Surgical versus non-surgical options and prognosis for recovery have been reviewed and the inherent risks and benefits of each have been discussed including the risks of infection, bleeding, injury to nerves/blood vessels/tendons, incomplete relief of symptoms, persisting pain and/or stiffness, loss of function, complex regional pain syndrome, failure of the procedure, as appropriate.  Document Attestation: I, Dawn Royse, have reviewed and updated documentation for [name], MD, utilizing N[employer] DAX.   Electronically signed by Marlena Clipper, MD at 05/22/2023 8:   Reviewed  H+P. No changes noted.

## 2023-06-02 NOTE — Transfer of Care (Signed)
Immediate Anesthesia Transfer of Care Note  Patient: Kurt Patel.  Procedure(s) Performed: Bilateral carpal tunnel releases (Bilateral)  Patient Location: PACU  Anesthesia Type:General  Level of Consciousness: awake, alert , and oriented  Airway & Oxygen Therapy: Patient Spontanous Breathing and Patient connected to nasal cannula oxygen  Post-op Assessment: Report given to RN and Post -op Vital signs reviewed and stable  Post vital signs: Reviewed and stable  Last Vitals: See PACU flow sheet or handwritten chart.  CRNA noted normal V/S   Vitals Value Taken Time  BP    Temp    Pulse    Resp    SpO2      Last Pain:  Vitals:   06/02/23 1151  PainSc: 0-No pain         Complications: No notable events documented.

## 2023-06-02 NOTE — Discharge Instructions (Signed)
Loosen Ace wrap prior to dismissal and if the fingers swell at home  okay to work on range of motion of fingers  Pain medicine as previously directed Call office if you are having any problems  [phone] 

## 2023-06-02 NOTE — Anesthesia Procedure Notes (Signed)
Procedure Name: Intubation Date/Time: 06/02/2023 1:36 PM  Performed by: Genia Del, CRNAPre-anesthesia Checklist: Patient identified, Patient being monitored, Timeout performed, Emergency Drugs available and Suction available Patient Re-evaluated:Patient Re-evaluated prior to induction Oxygen Delivery Method: Circle system utilized Preoxygenation: Pre-oxygenation with 100% oxygen Induction Type: IV induction Ventilation: Mask ventilation without difficulty Laryngoscope Size: McGraph and 4 Grade View: Grade I Tube type: Oral Tube size: 7.5 mm Number of attempts: 1 Airway Equipment and Method: Stylet Placement Confirmation: ETT inserted through vocal cords under direct vision, positive ETCO2 and breath sounds checked- equal and bilateral Secured at: 22 cm Tube secured with: Tape Dental Injury: Teeth and Oropharynx as per pre-operative assessment

## 2023-06-03 ENCOUNTER — Encounter: Payer: Self-pay | Admitting: Orthopedic Surgery

## 2023-06-03 NOTE — Anesthesia Postprocedure Evaluation (Signed)
Anesthesia Post Note  Patient: Kurt Patel.  Procedure(s) Performed: Bilateral carpal tunnel releases (Bilateral)  Patient location during evaluation: PACU Anesthesia Type: General Level of consciousness: awake and alert Pain management: pain level controlled Vital Signs Assessment: post-procedure vital signs reviewed and stable Respiratory status: spontaneous breathing, nonlabored ventilation, respiratory function stable and patient connected to nasal cannula oxygen Cardiovascular status: blood pressure returned to baseline and stable Postop Assessment: no apparent nausea or vomiting Anesthetic complications: no   No notable events documented.   Last Vitals:  Vitals:   06/02/23 1422 06/02/23 1444  BP: (!) 176/97 (!) 160/85  Pulse: 99   Resp: 18 20  Temp: 36.7 C (!) 36.4 C  SpO2: 96% 97%    Last Pain:  Vitals:   06/03/23 1303  PainSc: 5                  Genice Kimberlin C Derrik Mceachern

## 2023-10-09 ENCOUNTER — Ambulatory Visit: Payer: 59 | Admitting: Family Medicine

## 2023-10-09 ENCOUNTER — Encounter: Payer: Self-pay | Admitting: Family Medicine

## 2023-10-09 VITALS — BP 102/76 | HR 98 | Ht 70.0 in | Wt 223.0 lb

## 2023-10-09 DIAGNOSIS — Z1211 Encounter for screening for malignant neoplasm of colon: Secondary | ICD-10-CM

## 2023-10-09 DIAGNOSIS — K2970 Gastritis, unspecified, without bleeding: Secondary | ICD-10-CM | POA: Diagnosis not present

## 2023-10-09 DIAGNOSIS — F17200 Nicotine dependence, unspecified, uncomplicated: Secondary | ICD-10-CM

## 2023-10-09 MED ORDER — DEXLANSOPRAZOLE 60 MG PO CPDR: 60.0000 mg | DELAYED_RELEASE_CAPSULE | Freq: Every day | ORAL | 1 refills | Status: DC
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
## 2024-02-23 ENCOUNTER — Encounter: Payer: Self-pay | Admitting: Family Medicine

## 2024-02-23 ENCOUNTER — Ambulatory Visit
Admission: RE | Admit: 2024-02-23 | Discharge: 2024-02-23 | Disposition: A | Discharge status: Home / Self Care | Attending: Family Medicine | Admitting: Family Medicine

## 2024-02-23 ENCOUNTER — Ambulatory Visit
Admission: RE | Admit: 2024-02-23 | Discharge: 2024-02-23 | Disposition: A | Discharge status: Home / Self Care | Source: Ambulatory Visit | Attending: Family Medicine | Admitting: Family Medicine

## 2024-02-23 ENCOUNTER — Ambulatory Visit: Admitting: Family Medicine

## 2024-02-23 VITALS — BP 118/82 | HR 116 | Ht 70.0 in | Wt 224.8 lb

## 2024-02-23 DIAGNOSIS — R058 Other specified cough: Secondary | ICD-10-CM

## 2024-02-23 DIAGNOSIS — R0609 Other forms of dyspnea: Secondary | ICD-10-CM

## 2024-02-23 MED ORDER — ALBUTEROL SULFATE HFA 108 (90 BASE) MCG/ACT IN AERS
2.0000 | INHALATION_SPRAY | Freq: Four times a day (QID) | RESPIRATORY_TRACT | 2 refills | Status: AC | PRN
Start: 2024-02-23 — End: ?
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
## 2024-02-26 ENCOUNTER — Telehealth: Payer: Self-pay | Admitting: Family Medicine

## 2024-02-26 ENCOUNTER — Telehealth: Payer: Self-pay

## 2024-02-26 NOTE — Telephone Encounter (Signed)
 Xray results are not back yet will call pt with result.  KP  Copied from CRM [id number]. Topic: Clinical - Lab/Test Results >> Feb 26, 2024  1:41 PM Antwanette L wrote: Reason for CRM: Patient is requesting a callback when his x ray results are ready. Patient can be contacted by  hone [phone].

## 2024-02-26 NOTE — Telephone Encounter (Signed)
 Called pt let him know that a referral can take up to 7-10 days. Told pt to call back after 10 days so we can get him scheduled. Pt verbalized understanding.  KP

## 2024-02-26 NOTE — Telephone Encounter (Signed)
 Copied from CRM [id number]. Topic: Referral - Status >> Feb 26, 2024  1:42 PM Antwanette L wrote: Reason for CRM: Patient is calling to get an update on his referral to see a pulmonary doctor. I told the patient the referral was requested on 3/18 and I do not have any updates at the moment. Patient is requesting a call back at [phone]

## 2024-03-02 ENCOUNTER — Encounter: Payer: Self-pay | Admitting: Family Medicine

## 2024-03-10 ENCOUNTER — Encounter: Payer: Self-pay | Admitting: Emergency Medicine

## 2024-03-10 ENCOUNTER — Ambulatory Visit
Admission: EM | Admit: 2024-03-10 | Discharge: 2024-03-10 | Disposition: A | Discharge status: Home / Self Care | Attending: Physician Assistant | Admitting: Physician Assistant

## 2024-03-10 DIAGNOSIS — J019 Acute sinusitis, unspecified: Secondary | ICD-10-CM

## 2024-03-10 DIAGNOSIS — R058 Other specified cough: Secondary | ICD-10-CM

## 2024-03-10 DIAGNOSIS — Z8709 Personal history of other diseases of the respiratory system: Secondary | ICD-10-CM | POA: Diagnosis not present

## 2024-03-10 DIAGNOSIS — Z720 Tobacco use: Secondary | ICD-10-CM

## 2024-03-10 HISTORY — DX: Chronic obstructive pulmonary disease, unspecified: J44.9

## 2024-03-10 MED ORDER — AMOXICILLIN-POT CLAVULANATE 875-125 MG PO TABS: 1.0000 | ORAL_TABLET | Freq: Two times a day (BID) | ORAL | 0 refills | Status: AC

## 2024-03-10 MED ORDER — PSEUDOEPH-BROMPHEN-DM 30-2-10 MG/5ML PO SYRP: 10.0000 mL | ORAL_SOLUTION | Freq: Four times a day (QID) | ORAL | 0 refills | Status: AC | PRN

## 2024-03-10 MED ORDER — IPRATROPIUM BROMIDE 0.06 % NA SOLN: 2.0000 | Freq: Four times a day (QID) | NASAL | 0 refills | Status: AC

## 2024-03-10 NOTE — Discharge Instructions (Addendum)
-  Begin antibiotics.  Use your inhaler as needed.  I also sent cough medicine and nasal spray.  Increase rest and fluids. - Try to stop smoking. - Return if fever, chest pain or worsening cough or breathing trouble.

## 2024-03-10 NOTE — ED Triage Notes (Signed)
 Pt c/o sinus pressure and head congestion x 6 days. Pt has taken OTC sinus medication with no relief.
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
## 2024-03-25 ENCOUNTER — Ambulatory Visit: Payer: Self-pay | Admitting: Family Medicine

## 2024-04-06 ENCOUNTER — Ambulatory Visit: Admitting: Internal Medicine

## 2024-04-06 ENCOUNTER — Encounter: Payer: Self-pay | Admitting: Internal Medicine

## 2024-04-06 VITALS — BP 120/80 | HR 106 | Temp 98.6°F | Ht 69.0 in | Wt 221.6 lb

## 2024-04-06 DIAGNOSIS — F17210 Nicotine dependence, cigarettes, uncomplicated: Secondary | ICD-10-CM | POA: Diagnosis not present

## 2024-04-06 DIAGNOSIS — J449 Chronic obstructive pulmonary disease, unspecified: Secondary | ICD-10-CM | POA: Diagnosis not present

## 2024-04-06 DIAGNOSIS — R051 Acute cough: Secondary | ICD-10-CM

## 2024-04-06 MED ORDER — BREZTRI AEROSPHERE 160-9-4.8 MCG/ACT IN AERO
2.0000 | INHALATION_SPRAY | Freq: Two times a day (BID) | RESPIRATORY_TRACT | 2 refills | Status: AC
Start: 2024-04-06 — End: ?

## 2024-04-06 MED ORDER — BREZTRI AEROSPHERE 160-9-4.8 MCG/ACT IN AERO: 2.0000 | INHALATION_SPRAY | Freq: Two times a day (BID) | RESPIRATORY_TRACT | Status: DC

## 2024-04-06 NOTE — Patient Instructions (Signed)
 Please stop smoking!  Start BREZTRI 2 puffs in am and 2 puffs in pm Rinse mouth after every use Use albuterol  2 puffs every 4 hours as needed  Recommend obtaining pulmonary function test  Avoid Allergens and Irritants Avoid secondhand smoke Avoid SICK contacts Recommend  Masking  when appropriate Recommend Keep up-to-date with vaccinations

## 2024-04-06 NOTE — Progress Notes (Signed)
 [facility] Pulmonary Medicine Consultation      Date: 04/06/2024,   MRN# [id number] Kurt Patel. June 14, 1976    CHIEF COMPLAINT:   Assessment for cough   HISTORY OF PRESENT ILLNESS   48 year old pleasant white male seen today for shortness of breath wheezing cough Patient with extensive smoking history 1 pack a day for 30 years No previous pneumonia or COVID-19 infections Works as a Careers information officer also despite getting Patient does use albuterol  as inhaler and seems to help significantly  No exacerbation at this time No evidence of heart failure at this time No evidence or signs of infection at this time No respiratory distress No fevers, chills, nausea, vomiting, diarrhea No evidence of lower extremity edema No evidence hemoptysis    Chest x-ray February 23, 2024 Independently reviewed by me today in detail Possible hyperexpanded lungs Flattened diaphragms No pneumonia No effusions No edema Consider possible underlying obstructive lung disease        PAST MEDICAL HISTORY   Past Medical History:  Diagnosis Date   Arthritis    hands, right hip   COPD (chronic obstructive pulmonary disease) (HCC)    Dysphonia    Gastritis    GERD (gastroesophageal reflux disease)    Hiatal hernia    Hypertension    Legg-Calve-Perthes disease    right hip   Ulcerative laryngitis    Vertigo    (x1) - 4 or 5 yrs ago   Wears dentures    full upper     SURGICAL HISTORY   Past Surgical History:  Procedure Laterality Date   CARPAL TUNNEL RELEASE Bilateral 06/02/2023   Procedure: Bilateral carpal tunnel releases;  Surgeon: Molli Angelucci, MD;  Location: [facility];  Service: Orthopedics;  Laterality: Bilateral;   ESOPHAGOGASTRODUODENOSCOPY (EGD) WITH PROPOFOL  N/A 06/05/2016   Procedure: ESOPHAGOGASTRODUODENOSCOPY (EGD) WITH PROPOFOL ;  Surgeon: Marnee Sink, MD;  Location: [facility];  Service: Endoscopy;  Laterality: N/A;   HIP SURGERY      SHOULDER ARTHROSCOPY Right 09/05/2015   Procedure: ARTHROSCOPY SHOULDER DEBRIEDMENT and decompression;  Surgeon: Elner Hahn, MD;  Location: [facility];  Service: Orthopedics;  Laterality: Right;   SKIN CANCER EXCISION     back   THROAT SURGERY  12/14/2014   polyps removed- benign     FAMILY HISTORY   Family History  Problem Relation Age of Onset   Diabetes Mother    Hyperlipidemia Mother    Hyperlipidemia Father    Stroke Father      SOCIAL HISTORY   Social History   Tobacco Use   Smoking status: Every Day    Current packs/day: 1.00    Average packs/day: 1 pack/day for 30.0 years (30.0 ttl pk-yrs)    Types: Cigarettes   Smokeless tobacco: Never   Tobacco comments:    Started smoking age 13.  Vaping Use   Vaping status: Never Used  Substance Use Topics   Alcohol use: Yes    Alcohol/week: 3.0 standard drinks of alcohol    Types: 3 Cans of beer per week    Comment: in last month   Drug use: No     MEDICATIONS    Home Medication:  Current Outpatient Rx   Order #: [id number] Class: Normal   Order #: [id number] Class: Normal   Order #: [id number] Class: Normal    Current Medication:  Current Outpatient Medications:    albuterol  (VENTOLIN  HFA) 108 (90 Base) MCG/ACT inhaler, Inhale 2 puffs into the lungs every 6 (six) hours  as needed for wheezing or shortness of breath., Disp: 8 g, Rfl: 2   dexlansoprazole  (DEXILANT ) 60 MG capsule, Take 1 capsule (60 mg total) by mouth daily., Disp: 90 capsule, Rfl: 1   ipratropium (ATROVENT ) 0.06 % nasal spray, Place 2 sprays into both nostrils 4 (four) times daily., Disp: 15 mL, Rfl: 0    ALLERGIES   Buspirone and Prednisone   BP 120/80 (BP Location: Right Arm, Patient Position: Sitting, Cuff Size: Large)   Pulse (!) 106   Temp 98.6 F (37 C) (Oral)   Ht 5\' 9"  (1.753 m)   Wt 221 lb 9.6 oz (100.5 kg)   SpO2 95%   BMI 32.72 kg/m    Review of Systems: Gen:  Denies  fever, sweats, chills weight loss  HEENT:  Denies blurred vision, double vision, ear pain, eye pain, hearing loss, nose bleeds, sore throat Cardiac:  No dizziness, chest pain or heaviness, chest tightness,edema, No JVD Resp:   + cough, -sputum production, -shortness of breath,-wheezing, -hemoptysis,  Other:  All other systems negative   Physical Examination:   General Appearance: No distress  EYES PERRLA, EOM intact.   NECK Supple, No JVD Pulmonary: normal breath sounds, No wheezing.  CardiovascularNormal S1,S2.  No m/r/g.   Abdomen: Benign, Soft, non-tender. 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  Assessment of COPD Obtain pulmonary function test to assess lung function Start Breztri inhaler Rinse mouth after use Use albuterol  as needed Avoid Allergens and Irritants Avoid secondhand smoke Avoid SICK contacts Recommend  Masking  when appropriate Recommend Keep up-to-date with vaccinations    Smoking Assessment and Cessation Counseling Upon further questioning, Patient smokes 1 ppd I have advised patient to quit/stop smoking as soon as possible due to high risk for multiple medical problems Patient is NOT willing to quit smoking I have advised patient that we can assist and have options of Nicotine  replacement therapy. I also advised patient on behavioral therapy and can provide oral medication therapy in conjunction with the other therapies Follow up next Office visit  for assessment of smoking cessation Smoking cessation counseling advised for >10 minutes    MEDICATION ADJUSTMENTS/LABS AND TESTS ORDERED: Smoking cessation strongly advised Start BREZTRI 2 puffs in am and 2 puffs in pm Rinse mouth after every use Use albuterol  2 puffs  every 4 hours as needed Recommend obtaining pulmonary function test Avoid Allergens and Irritants Avoid secondhand smoke Avoid SICK contacts Recommend  Masking  when appropriate Recommend Keep up-to-date with vaccinations   CURRENT MEDICATIONS REVIEWED AT LENGTH WITH PATIENT TODAY   Patient  satisfied with Plan of action and management. All questions answered  Follow up 4 weeks  I spent a total of 64  minutes reviewing chart data, face-to-face evaluation with the patient, counseling and coordination of care as detailed above.     Lady Pier, M.D.  [facility]monary [facility] Critical [facility]  Medical Director [facility] Medical Director [facility] Cardio-Pulmonary Department

## 2024-04-12 ENCOUNTER — Other Ambulatory Visit: Payer: Self-pay

## 2024-04-12 ENCOUNTER — Encounter

## 2024-04-12 ENCOUNTER — Ambulatory Visit: Admitting: Internal Medicine

## 2024-04-12 DIAGNOSIS — J449 Chronic obstructive pulmonary disease, unspecified: Secondary | ICD-10-CM | POA: Diagnosis not present

## 2024-04-12 NOTE — Patient Instructions (Signed)
 PFT completed today without post.

## 2024-04-12 NOTE — Progress Notes (Signed)
 Pt was unable to do 3 attempts to meet ATS on FVC. PFT done without post.

## 2024-05-03 ENCOUNTER — Ambulatory Visit: Payer: Self-pay | Admitting: Family Medicine

## 2024-05-03 ENCOUNTER — Encounter: Payer: Self-pay | Admitting: Family Medicine

## 2024-05-03 DIAGNOSIS — K2970 Gastritis, unspecified, without bleeding: Secondary | ICD-10-CM | POA: Diagnosis not present

## 2024-05-03 MED ORDER — DEXLANSOPRAZOLE 60 MG PO CPDR
60.0000 mg | DELAYED_RELEASE_CAPSULE | Freq: Every day | ORAL | 1 refills | Status: AC
Start: 2024-05-03 — End: ?
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
## 2024-05-26 ENCOUNTER — Ambulatory Visit: Admitting: Internal Medicine

## 2024-05-26 ENCOUNTER — Encounter: Payer: Self-pay | Admitting: Internal Medicine

## 2024-05-26 VITALS — BP 138/80 | HR 78 | Temp 98.7°F | Ht 70.0 in | Wt 220.4 lb

## 2024-05-26 DIAGNOSIS — F17200 Nicotine dependence, unspecified, uncomplicated: Secondary | ICD-10-CM | POA: Diagnosis not present

## 2024-05-26 DIAGNOSIS — R0600 Dyspnea, unspecified: Secondary | ICD-10-CM | POA: Diagnosis not present

## 2024-05-26 LAB — PULMONARY FUNCTION TEST
DL/VA % pred: 102 %
DL/VA: 4.69 ml/min/mmHg/L
DLCO unc % pred: 107 %
DLCO unc: 31.47 ml/min/mmHg
FEF 25-75 Pre: 2.19 L/s
FEF2575-%Pred-Pre: 60 %
FEV1-%Pred-Pre: 80 %
FEV1-Pre: 3.17 L
FEV1FVC-%Pred-Pre: 90 %
FEV6-%Pred-Pre: 90 %
FEV6-Pre: 4.42 L
FEV6FVC-%Pred-Pre: 102 %
FVC-%Pred-Pre: 88 %
Pre FEV1/FVC ratio: 71 %
Pre FEV6/FVC Ratio: 99 %
RV % pred: 221 %
RV: 4.17 L
TLC % pred: 124 %
TLC: 8.42 L

## 2024-05-26 NOTE — Progress Notes (Unsigned)
 [facility] [place] Pulmonary Medicine Consultation      Date: 05/26/2024,   MRN# [id number] Bobak Oguinn. 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  as inhaler and seems to help significantly  CHIEF COMPLAINT:  Assessment of SOB,COPD   HISTORY OF PRESENT ILLNESS   No exacerbation at this time No evidence of heart failure at this time No evidence or signs of infection at this time No respiratory distress No fevers, chills, nausea, vomiting, diarrhea No evidence of lower extremity edema No evidence hemoptysis   Pulmonary function testing on Apr 11, 2024 Patient unable to complete testing due to failed attempts based on ATS criteria Spirometry shows FEV1 FVC ratio right at 71% predicted with FEV1 at 80% predicted TLC elevated 124% predicted RV/TLC ratio 176% predicted RV 221% predicted Flow-volume loop suggest obstructive pattern in the expiratory limb Findings and interpretation-strong suggestion of underlying mild obstructive airways disease, however patient failed to complete ATS criteria for testing   Chest x-ray February 23, 2024 Independently reviewed by me today in detail Possible hyperexpanded lungs Flattened diaphragms No pneumonia No effusions No edema Consider possible underlying obstructive lung disease        PAST MEDICAL HISTORY   Past Medical History:  Diagnosis Date   Arthritis    hands, right hip   COPD (chronic obstructive pulmonary disease) (HCC)    Dysphonia    Gastritis    GERD (gastroesophageal reflux disease)    Hiatal hernia    Hypertension    Legg-Calve-Perthes disease    right hip   Ulcerative laryngitis    Vertigo    (x1) - 4 or 5 yrs ago   Wears dentures    full upper      SURGICAL HISTORY   Past Surgical History:  Procedure Laterality Date   CARPAL TUNNEL RELEASE Bilateral 06/02/2023   Procedure: Bilateral carpal tunnel releases;  Surgeon: Molli Angelucci, MD;  Location: [facility];  Service: Orthopedics;  Laterality: Bilateral;   ESOPHAGOGASTRODUODENOSCOPY (EGD) WITH PROPOFOL  N/A 06/05/2016   Procedure: ESOPHAGOGASTRODUODENOSCOPY (EGD) WITH PROPOFOL ;  Surgeon: Marnee Sink, MD;  Location: [facility];  Service: Endoscopy;  Laterality: N/A;   HIP SURGERY     SHOULDER ARTHROSCOPY Right 09/05/2015   Procedure: ARTHROSCOPY SHOULDER DEBRIEDMENT and decompression;  Surgeon: Elner Hahn, MD;  Location: [facility];  Service: Orthopedics;  Laterality: Right;   SKIN CANCER EXCISION     back   THROAT SURGERY  12/14/2014   polyps removed- benign     FAMILY HISTORY   Family History  Problem Relation Age of Onset   Diabetes Mother    Hyperlipidemia Mother    Hyperlipidemia Father    Stroke Father      SOCIAL HISTORY   Social History   Tobacco Use   Smoking status: Every Day    Current packs/day: 1.00    Average packs/day: 1 pack/day for 30.0 years (30.0 ttl pk-yrs)    Types: Cigarettes   Smokeless tobacco: Never   Tobacco comments:    Started smoking age 63.  Vaping Use   Vaping status: Never Used  Substance Use Topics   Alcohol use: Yes    Alcohol/week: 3.0 standard drinks of alcohol    Types: 3 Cans of beer per week  Comment: in last month   Drug use: No     MEDICATIONS    Home Medication:  Current Outpatient Rx   Order #: [id number] Class: Normal   Order #: [id number] Class: Normal   Order #: [id number] Class: Normal   Order #: [id number] Class: Normal    Current Medication:  Current Outpatient Medications:    albuterol  (VENTOLIN  HFA) 108 (90 Base) MCG/ACT inhaler, Inhale 2 puffs into the lungs every 6 (six) hours as needed for wheezing or shortness of breath., Disp: 8 g, Rfl: 2    budeson-glycopyrrolate -formoterol (BREZTRI  AEROSPHERE) 160-9-4.8 MCG/ACT AERO inhaler, Inhale 2 puffs into the lungs in the morning and at bedtime., Disp: 1 each, Rfl: 2   dexlansoprazole  (DEXILANT ) 60 MG capsule, Take 1 capsule (60 mg total) by mouth daily., Disp: 90 capsule, Rfl: 1   ipratropium (ATROVENT ) 0.06 % nasal spray, Place 2 sprays into both nostrils 4 (four) times daily. (Patient not taking: Reported on 05/26/2024), Disp: 15 mL, Rfl: 0    ALLERGIES   Buspirone and Prednisone  BP 138/80 (BP Location: Left Arm, Patient Position: Sitting, Cuff Size: Large)   Pulse 78   Temp 98.7 F (37.1 C) (Oral)   Ht 5' 10 (1.778 m)   Wt 220 lb 6.4 oz (100 kg)   SpO2 98%   BMI 31.62 kg/m       Review of Systems: Gen:  Denies  fever, sweats, chills weight loss  HEENT: Denies blurred vision, double vision, ear pain, eye pain, hearing loss, nose bleeds, sore throat Cardiac:  No dizziness, chest pain or heaviness, chest tightness,edema, No JVD Resp:   No cough, -sputum production, -shortness of breath,-wheezing, -hemoptysis,  Other:  All other systems negative   Physical Examination:   General Appearance: No distress  EYES PERRLA, EOM intact.   NECK Supple, No JVD Pulmonary: normal breath sounds, No wheezing.  CardiovascularNormal S1,S2.  No m/r/g.   Abdomen: Benign, Soft, non-tender. Neurology UE/LE 5/5 strength, no focal deficits Ext pulses intact, cap refill intact ALL OTHER ROS ARE NEGATIVE          ASSESSMENT/PLAN   48 year old pleasant white male seen today for follow up extensive tobacco abuse in the setting of hyperinflated lungs with flattened diaphragms findings are consistent with COPD emphysema Although patient was unable to meet ATS criteria for pulmonary function testing however there is evidence of hyperinflated lungs air trapping and mild obstructive disease that is related to underlying COPD and active smoking  Assessment of COPD Mild COPD at this  time Use albuterol  as needed   Smoking Assessment and Cessation Counseling Upon further questioning, Patient smokes 1 ppd I have advised patient to quit/stop smoking as soon as possible due to high risk for multiple medical problems Patient  is NOT willing to quit smoking I have advised patient that we can assist and have options of Nicotine  replacement therapy. I also advised patient on behavioral therapy and can provide oral medication therapy in conjunction with the other therapies Follow up next Office visit  for assessment of smoking cessation Smoking cessation counseling advised for >10 minutes Patient has a high risk of cancer Patient has high risk of progression of obstructive disease    MEDICATION ADJUSTMENTS/LABS AND TESTS ORDERED: Smoking cessation strongly advised Use albuterol  2 puffs every 4 hours as needed Avoid Allergens and Irritants Avoid secondhand smoke Avoid SICK contacts Recommend  Masking  when appropriate Recommend Keep up-to-date with vaccinations   CURRENT MEDICATIONS REVIEWED AT LENGTH WITH PATIENT TODAY   Patient  satisfied  with Plan of action and management. All questions answered  Follow up 6 months  I spent a total of 45  minutes reviewing chart data, face-to-face evaluation with the patient, counseling and coordination of care as detailed above.     Lady Pier, M.D.  [facility]  Medical Director [facility] Medical Director [facility] Cardio-Pulmonary Department

## 2024-05-26 NOTE — Patient Instructions (Signed)
 PLEASE STOP SMOKING!!  Recommend using Albuterol  as needed Can use Albuterol   2 puffs  before you have any type of strenuous activity  Avoid Allergens and Irritants Avoid secondhand smoke Avoid SICK contacts Recommend  Masking  when appropriate Recommend Keep up-to-date with vaccinations
